# Patient Record
Sex: Female | Born: 1950 | Race: White | Hispanic: No | Marital: Single | Smoking: Never smoker
Health system: Southern US, Community
[De-identification: ages and names within clinical notes are randomized; demographics above are authoritative.]

## PROBLEM LIST (undated history)

## (undated) DIAGNOSIS — M8580 Other specified disorders of bone density and structure, unspecified site: Secondary | ICD-10-CM

## (undated) DIAGNOSIS — I10 Essential (primary) hypertension: Secondary | ICD-10-CM

## (undated) DIAGNOSIS — E213 Hyperparathyroidism, unspecified: Secondary | ICD-10-CM

## (undated) DIAGNOSIS — E7800 Pure hypercholesterolemia, unspecified: Secondary | ICD-10-CM

## (undated) DIAGNOSIS — G43909 Migraine, unspecified, not intractable, without status migrainosus: Secondary | ICD-10-CM

## (undated) DIAGNOSIS — R011 Cardiac murmur, unspecified: Secondary | ICD-10-CM

## (undated) DIAGNOSIS — R7989 Other specified abnormal findings of blood chemistry: Secondary | ICD-10-CM

## (undated) HISTORY — DX: Other specified disorders of bone density and structure, unspecified site: M85.80

## (undated) HISTORY — PX: PARATHYROIDECTOMY: SHX19

## (undated) HISTORY — DX: Migraine, unspecified, not intractable, without status migrainosus: G43.909

## (undated) HISTORY — DX: Other specified abnormal findings of blood chemistry: R79.89

---

## 1999-05-03 ENCOUNTER — Other Ambulatory Visit: Admission: RE | Admit: 1999-05-03 | Discharge: 1999-05-03 | Payer: Self-pay | Admitting: Obstetrics & Gynecology

## 2000-06-21 ENCOUNTER — Other Ambulatory Visit: Admission: RE | Admit: 2000-06-21 | Discharge: 2000-06-21 | Payer: Self-pay | Admitting: Obstetrics & Gynecology

## 2001-09-05 ENCOUNTER — Other Ambulatory Visit: Admission: RE | Admit: 2001-09-05 | Discharge: 2001-09-05 | Payer: Self-pay | Admitting: Obstetrics & Gynecology

## 2002-10-22 ENCOUNTER — Other Ambulatory Visit: Admission: RE | Admit: 2002-10-22 | Discharge: 2002-10-22 | Payer: Self-pay | Admitting: Obstetrics & Gynecology

## 2003-10-29 ENCOUNTER — Other Ambulatory Visit: Admission: RE | Admit: 2003-10-29 | Discharge: 2003-10-29 | Payer: Self-pay | Admitting: Obstetrics & Gynecology

## 2004-11-12 ENCOUNTER — Other Ambulatory Visit: Admission: RE | Admit: 2004-11-12 | Discharge: 2004-11-12 | Payer: Self-pay | Admitting: Obstetrics & Gynecology

## 2005-11-14 ENCOUNTER — Other Ambulatory Visit: Admission: RE | Admit: 2005-11-14 | Discharge: 2005-11-14 | Payer: Self-pay | Admitting: Obstetrics & Gynecology

## 2015-03-18 ENCOUNTER — Other Ambulatory Visit: Payer: Self-pay | Admitting: Internal Medicine

## 2015-03-18 DIAGNOSIS — R160 Hepatomegaly, not elsewhere classified: Secondary | ICD-10-CM

## 2015-03-30 ENCOUNTER — Ambulatory Visit
Admission: RE | Admit: 2015-03-30 | Discharge: 2015-03-30 | Disposition: A | Discharge status: Home / Self Care | Payer: 59 | Source: Ambulatory Visit | Attending: Family Medicine | Admitting: Family Medicine

## 2015-03-30 DIAGNOSIS — R160 Hepatomegaly, not elsewhere classified: Secondary | ICD-10-CM

## 2015-03-30 MED ORDER — GADOBENATE DIMEGLUMINE 529 MG/ML IV SOLN
15.0000 mL | Freq: Once | INTRAVENOUS | Status: AC | PRN
  Administered 2015-03-30: 15 mL via INTRAVENOUS

## 2015-10-12 ENCOUNTER — Encounter (HOSPITAL_BASED_OUTPATIENT_CLINIC_OR_DEPARTMENT_OTHER): Payer: Self-pay

## 2015-10-12 ENCOUNTER — Emergency Department (HOSPITAL_BASED_OUTPATIENT_CLINIC_OR_DEPARTMENT_OTHER): Payer: 59

## 2015-10-12 ENCOUNTER — Emergency Department (HOSPITAL_BASED_OUTPATIENT_CLINIC_OR_DEPARTMENT_OTHER)
Admission: EM | Admit: 2015-10-12 | Discharge: 2015-10-12 | Disposition: A | Discharge status: Home / Self Care | Payer: 59 | Attending: Emergency Medicine | Admitting: Emergency Medicine

## 2015-10-12 DIAGNOSIS — I10 Essential (primary) hypertension: Secondary | ICD-10-CM | POA: Diagnosis present

## 2015-10-12 DIAGNOSIS — R51 Headache: Secondary | ICD-10-CM | POA: Diagnosis not present

## 2015-10-12 DIAGNOSIS — R011 Cardiac murmur, unspecified: Secondary | ICD-10-CM | POA: Insufficient documentation

## 2015-10-12 HISTORY — DX: Pure hypercholesterolemia, unspecified: E78.00

## 2015-10-12 HISTORY — DX: Cardiac murmur, unspecified: R01.1

## 2015-10-12 LAB — CBC WITH DIFFERENTIAL/PLATELET
BASOS PCT: 0 %
Basophils Absolute: 0 10*3/uL (ref 0.0–0.1)
EOS ABS: 0 10*3/uL (ref 0.0–0.7)
EOS PCT: 0 %
HCT: 41.4 % (ref 36.0–46.0)
HEMOGLOBIN: 13.5 g/dL (ref 12.0–15.0)
Lymphocytes Relative: 28 %
Lymphs Abs: 2.6 10*3/uL (ref 0.7–4.0)
MCH: 30.2 pg (ref 26.0–34.0)
MCHC: 32.6 g/dL (ref 30.0–36.0)
MCV: 92.6 fL (ref 78.0–100.0)
MONOS PCT: 3 %
Monocytes Absolute: 0.3 10*3/uL (ref 0.1–1.0)
NEUTROS PCT: 69 %
Neutro Abs: 6.2 10*3/uL (ref 1.7–7.7)
PLATELETS: 311 10*3/uL (ref 150–400)
RBC: 4.47 MIL/uL (ref 3.87–5.11)
RDW: 12.9 % (ref 11.5–15.5)
WBC: 9.1 10*3/uL (ref 4.0–10.5)

## 2015-10-12 LAB — HEPATIC FUNCTION PANEL
ALT: 11 U/L — AB (ref 14–54)
AST: 21 U/L (ref 15–41)
Albumin: 4.5 g/dL (ref 3.5–5.0)
Alkaline Phosphatase: 65 U/L (ref 38–126)
TOTAL PROTEIN: 7.8 g/dL (ref 6.5–8.1)
Total Bilirubin: 0.5 mg/dL (ref 0.3–1.2)

## 2015-10-12 LAB — URINE MICROSCOPIC-ADD ON

## 2015-10-12 LAB — BASIC METABOLIC PANEL
Anion gap: 8 (ref 5–15)
BUN: 8 mg/dL (ref 6–20)
CALCIUM: 12.1 mg/dL — AB (ref 8.9–10.3)
CHLORIDE: 103 mmol/L (ref 101–111)
CO2: 29 mmol/L (ref 22–32)
Creatinine, Ser: 0.83 mg/dL (ref 0.44–1.00)
GFR calc Af Amer: >60 mL/min (ref >60)
GFR calc non Af Amer: >60 mL/min (ref >60)
GLUCOSE: 113 mg/dL — AB (ref 65–99)
Potassium: 4.5 mmol/L (ref 3.5–5.1)
SODIUM: 140 mmol/L (ref 135–145)

## 2015-10-12 LAB — URINALYSIS, ROUTINE W REFLEX MICROSCOPIC
BILIRUBIN URINE: NEGATIVE
GLUCOSE, UA: NEGATIVE mg/dL
KETONES UR: NEGATIVE mg/dL
Leukocytes, UA: NEGATIVE
Nitrite: NEGATIVE
PROTEIN: 30 mg/dL — AB
Specific Gravity, Urine: 1.007 (ref 1.005–1.030)
UROBILINOGEN UA: 0.2 mg/dL (ref 0.0–1.0)
pH: 6.5 (ref 5.0–8.0)

## 2015-10-12 MED ORDER — HYDROCHLOROTHIAZIDE 25 MG PO TABS: 25.0000 mg | ORAL_TABLET | Freq: Every day | ORAL | Status: DC

## 2015-10-12 NOTE — ED Notes (Signed)
Patient transported to X-ray 

## 2015-10-12 NOTE — ED Notes (Addendum)
Elevated BP x today-no hx of same-denies pain-steady gait-NAD-was seen by PCP today with new rx for HTN-states office did EKG and labs-pt appears very exited with nonstop talking-states she has been under a great deal of stress

## 2015-10-12 NOTE — ED Provider Notes (Signed)
CSN: [id number]     Arrival date & time 10/12/15  1429 History  By signing my name below, I, Meriel Pica, attest that this documentation has been prepared under the direction and in the presence of Delora Fuel, MD. Electronically Signed: Meriel Pica, ED Scribe. 10/12/2015. 5:23 PM.   Chief Complaint  Patient presents with  . Hypertension   The history is provided by the patient. No language interpreter was used.   HPI Comments: Lori Cohen is a 64 y.o. female, with a PMhx of HLD, who presents to the Emergency Department complaining of hypertension onset this morning with the associated intermittent sensation of  'numbness in my head' that lasts for several minutes at onset. Pt reports she checked her BP this morning because she was feeling 'weird' and found it to be 200/114mmHg. She does not check her BP regularly at home and notes the last time she checked it herself was 6 months ago. The pt was then seen by an MD at [facility] today and prescribed a new HTN medication and advised to seek ED evaluation if she began to feel 'different'; she has not taken a dose if the medication yet. She has a follow up appointment with this MD in 1 week. She is not prescribed any other medications but endorses taking sudafed and tylenol sporadically for migraines. The pt states she does not have a PCP and has only visited the [facility] twice. 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 Denies dizziness, light-headedness, tinnitus, visual changes, epistaxis, aphasia, or weakness. Pt does not smoke tobacco products.   Past Medical History  Diagnosis Date  . High cholesterol   . Heart murmur    History reviewed. No pertinent past surgical history. No family history on file. Social History  Substance Use Topics  . Smoking status: Never Smoker   . Smokeless tobacco: None  . Alcohol Use: No   OB History    No data available     Review of Systems  HENT: Negative for nosebleeds.    Eyes: Negative for photophobia and visual disturbance.  Neurological: Positive for headaches ( 'numbness in my head'). Negative for dizziness, speech difficulty, weakness and light-headedness.  All other systems reviewed and are negative.  Allergies  Review of patient's allergies indicates no known allergies.  Home Medications   Prior to Admission medications   Not on File   BP 183/98 mmHg  Pulse 110  Temp(Src) 98 F (36.7 C) (Oral)  Resp 18  Ht 5\' 1"  (1.549 m)  Wt 173 lb (78.472 kg)  BMI 32.70 kg/m2  SpO2 100% Physical Exam  Constitutional: She is oriented to person, place, and time. She appears well-developed and well-nourished.  HENT:  Head: Normocephalic and atraumatic.  Eyes: EOM are normal. Pupils are equal, round, and reactive to light.  Fundi are normal.  Neck: Normal range of motion. Neck supple. No JVD present.  Cardiovascular: Normal rate, regular rhythm and normal heart sounds.   No murmur heard. Pulmonary/Chest: Effort normal and breath sounds normal. She has no wheezes. She has no rales. She exhibits no tenderness.  Abdominal: Soft. Bowel sounds are normal. She exhibits no distension and no mass. There is no tenderness.  Musculoskeletal: Normal range of motion. She exhibits edema.  Trace pitting edema  Lymphadenopathy:    She has no cervical adenopathy.  Neurological: She is alert and oriented to person, place, and time. No cranial nerve deficit.  Skin: Skin is warm and dry. No  rash noted.  Psychiatric: She has a normal mood and affect. Her behavior is normal. Judgment and thought content normal.  Nursing note and vitals reviewed.   ED Course  Procedures  DIAGNOSTIC STUDIES: Oxygen Saturation is 100% on RA, normal by my interpretation.    COORDINATION OF CARE: 5:22 PM Discussed treatment plan with pt at bedside and pt agreed to plan.  Results for orders placed or performed during the hospital encounter of 83/38/25  Basic metabolic panel  Result  Value Ref Range   Sodium 140 135 - 145 mmol/L   Potassium 4.5 3.5 - 5.1 mmol/L   Chloride 103 101 - 111 mmol/L   CO2 29 22 - 32 mmol/L   Glucose, Bld 113 (H) 65 - 99 mg/dL   BUN 8 6 - 20 mg/dL   Creatinine, Ser 0.83 0.44 - 1.00 mg/dL   Calcium 12.1 (H) 8.9 - 10.3 mg/dL   GFR calc non Af Amer >60 >60 mL/min   GFR calc Af Amer >60 >60 mL/min   Anion gap 8 5 - 15  CBC with Differential  Result Value Ref Range   WBC 9.1 4.0 - 10.5 K/uL   RBC 4.47 3.87 - 5.11 MIL/uL   Hemoglobin 13.5 12.0 - 15.0 g/dL   HCT 41.4 36.0 - 46.0 %   MCV 92.6 78.0 - 100.0 fL   MCH 30.2 26.0 - 34.0 pg   MCHC 32.6 30.0 - 36.0 g/dL   RDW 12.9 11.5 - 15.5 %   Platelets 311 150 - 400 K/uL   Neutrophils Relative % 69 %   Neutro Abs 6.2 1.7 - 7.7 K/uL   Lymphocytes Relative 28 %   Lymphs Abs 2.6 0.7 - 4.0 K/uL   Monocytes Relative 3 %   Monocytes Absolute 0.3 0.1 - 1.0 K/uL   Eosinophils Relative 0 %   Eosinophils Absolute 0.0 0.0 - 0.7 K/uL   Basophils Relative 0 %   Basophils Absolute 0.0 0.0 - 0.1 K/uL  Urinalysis, Routine w reflex microscopic  Result Value Ref Range   Color, Urine YELLOW YELLOW   APPearance CLEAR CLEAR   Specific Gravity, Urine 1.007 1.005 - 1.030   pH 6.5 5.0 - 8.0   Glucose, UA NEGATIVE NEGATIVE mg/dL   Hgb urine dipstick MODERATE (A) NEGATIVE   Bilirubin Urine NEGATIVE NEGATIVE   Ketones, ur NEGATIVE NEGATIVE mg/dL   Protein, ur 30 (A) NEGATIVE mg/dL   Urobilinogen, UA 0.2 0.0 - 1.0 mg/dL   Nitrite NEGATIVE NEGATIVE   Leukocytes, UA NEGATIVE NEGATIVE  Hepatic function panel  Result Value Ref Range   Total Protein 7.8 6.5 - 8.1 g/dL   Albumin 4.5 3.5 - 5.0 g/dL   AST 21 15 - 41 U/L   ALT 11 (L) 14 - 54 U/L   Alkaline Phosphatase 65 38 - 126 U/L   Total Bilirubin 0.5 0.3 - 1.2 mg/dL   Bilirubin, Direct <0.1 (L) 0.1 - 0.5 mg/dL   Indirect Bilirubin NOT CALCULATED 0.3 - 0.9 mg/dL  Urine microscopic-add on  Result Value Ref Range   Squamous Epithelial / LPF RARE RARE   WBC,  UA 7-10 <3 WBC/hpf   Bacteria, UA RARE RARE   Dg Chest 2 View  10/12/2015  CLINICAL DATA:  64 year old female with a history of hypertension EXAM: CHEST - 2 VIEW COMPARISON:  None. FINDINGS: Cardiomediastinal silhouette projects within normal limits in size and contour. No confluent airspace disease, pneumothorax, or pleural effusion. No displaced fracture. Unremarkable appearance of the upper  abdomen. IMPRESSION: No radiographic evidence of acute cardiopulmonary disease. Signed, Dulcy Fanny. Earleen Newport, DO [facility] Electronically Signed   By: Corrie Mckusick D.O.   On: 10/12/2015 17:47   I have personally reviewed and evaluated these images and lab results as part of my medical decision-making.   EKG Interpretation   Date/Time:  Monday October 12 2015 14:54:08 EDT Ventricular Rate:  116 PR Interval:  172 QRS Duration: 80 QT Interval:  320 QTC Calculation: 444 R Axis:   17 Text Interpretation:  Sinus tachycardia Right atrial enlargement  Nonspecific ST and T wave abnormality Abnormal ECG No old tracing to  compare Confirmed by [facility]  MD, Mikaiah Stoffer ([id number]) on 10/12/2015 5:21:02 PM      MDM   Final diagnoses:  Essential hypertension  Hypercalcemia    Essential hypertension. Patient has not been monitoring her blood pressure at home. Blood pressure is not a dangerous level today. She had been prescribed something by her PCP but the pharmacy was out of it and she has requested that I give her a prescription for something. She does have mild edema, so decision is made to start her with a diuretic and she is given a prescription for hydrochlorothiazide. Screening labs are obtained and unexpected finding of elevated calcium was noted. Liver panel is normal. Chest x-ray is unremarkable. Urinalysis does show small amount of protein which could be related to hypertension. These findings were expanded patient and she is referred back to her PCP for  further outpatient workup.  I, Thomasina Housley, personally performed the services described in this documentation. All medical record entries made by the scribe were at my direction and in my presence.  I have reviewed the chart and discharge instructions and agree that the record reflects my personal performance and is accurate and complete. Brandan Glauber.  75/17/0017. 7:13 PM.      Delora Fuel, MD [id number]

## 2015-10-12 NOTE — ED Notes (Signed)
Ambulated pt. To bathroom.

## 2015-10-12 NOTE — ED Notes (Addendum)
Nurse first-Pt standing in ED lobby in conversation-NAD

## 2015-10-12 NOTE — Discharge Instructions (Signed)
Monitor your blood pressure at least once a day. Keep a log of it and take this log with you when you see her PCP. Try to stay on a low-salt diet.  Your calcium was elevated today. Please discuss this with your PCP. They may want to run additional tests, or, at the very least, repeat the test.  Your urine did show a small amount of protein. This may be the initial effects of blood pressure on your kidneys, although there are other things that can cause protein in your urine. Your PCP will monitor this.  Hypertension Hypertension, commonly called high blood pressure, is when the force of blood pumping through your arteries is too strong. Your arteries are the blood vessels that carry blood from your heart throughout your body. A blood pressure reading consists of a higher number over a lower number, such as 110/72. The higher number (systolic) is the pressure inside your arteries when your heart pumps. The lower number (diastolic) is the pressure inside your arteries when your heart relaxes. Ideally you want your blood pressure below 120/80. Hypertension forces your heart to work harder to pump blood. Your arteries may become narrow or stiff. Having untreated or uncontrolled hypertension can cause heart attack, stroke, kidney disease, and other problems. RISK FACTORS Some risk factors for high blood pressure are controllable. Others are not.  Risk factors you cannot control include:   Race. You may be at higher risk if you are African American.  Age. Risk increases with age.  Gender. Men are at higher risk than women before age 77 years. After age 66, women are at higher risk than men. Risk factors you can control include:  Not getting enough exercise or physical activity.  Being overweight.  Getting too much fat, sugar, calories, or salt in your diet.  Drinking too much alcohol. SIGNS AND SYMPTOMS Hypertension does not usually cause signs or symptoms. Extremely high blood pressure  (hypertensive crisis) may cause headache, anxiety, shortness of breath, and nosebleed. DIAGNOSIS To check if you have hypertension, your health care provider will measure your blood pressure while you are seated, with your arm held at the level of your heart. It should be measured at least twice using the same arm. Certain conditions can cause a difference in blood pressure between your right and left arms. A blood pressure reading that is higher than normal on one occasion does not mean that you need treatment. If it is not clear whether you have high blood pressure, you may be asked to return on a different day to have your blood pressure checked again. Or, you may be asked to monitor your blood pressure at home for 1 or more weeks. TREATMENT Treating high blood pressure includes making lifestyle changes and possibly taking medicine. Living a healthy lifestyle can help lower high blood pressure. You may need to change some of your habits. Lifestyle changes may include:  Following the DASH diet. This diet is high in fruits, vegetables, and whole grains. It is low in salt, red meat, and added sugars.  Keep your sodium intake below 2,300 mg per day.  Getting at least 30-45 minutes of aerobic exercise at least 4 times per week.  Losing weight if necessary.  Not smoking.  Limiting alcoholic beverages.  Learning ways to reduce stress. Your health care provider may prescribe medicine if lifestyle changes are not enough to get your blood pressure under control, and if one of the following is true:  You are 18-59 years  of age and your systolic blood pressure is above 140.  You are 11 years of age or older, and your systolic blood pressure is above 150.  Your diastolic blood pressure is above 90.  You have diabetes, and your systolic blood pressure is over 270 or your diastolic blood pressure is over 90.  You have kidney disease and your blood pressure is above 140/90.  You have heart disease  and your blood pressure is above 140/90. Your personal target blood pressure may vary depending on your medical conditions, your age, and other factors. HOME CARE INSTRUCTIONS  Have your blood pressure rechecked as directed by your health care provider.   Take medicines only as directed by your health care provider. Follow the directions carefully. Blood pressure medicines must be taken as prescribed. The medicine does not work as well when you skip doses. Skipping doses also puts you at risk for problems.  Do not smoke.   Monitor your blood pressure at home as directed by your health care provider. SEEK MEDICAL CARE IF:   You think you are having a reaction to medicines taken.  You have recurrent headaches or feel dizzy.  You have swelling in your ankles.  You have trouble with your vision. SEEK IMMEDIATE MEDICAL CARE IF:  You develop a severe headache or confusion.  You have unusual weakness, numbness, or feel faint.  You have severe chest or abdominal pain.  You vomit repeatedly.  You have trouble breathing. MAKE SURE YOU:   Understand these instructions.  Will watch your condition.  Will get help right away if you are not doing well or get worse.   This information is not intended to replace advice given to you by your health care provider. Make sure you discuss any questions you have with your health care provider.   Document Released: 12/12/2005 Document Revised: 04/28/2015 Document Reviewed: 10/04/2013 Elsevier Interactive Patient Education 2016 Elsevier Inc.  Low-Sodium Eating Plan Sodium raises blood pressure and causes water to be held in the body. Getting less sodium from food will help lower your blood pressure, reduce any swelling, and protect your heart, liver, and kidneys. We get sodium by adding salt (sodium chloride) to food. Most of our sodium comes from canned, boxed, and frozen foods. Restaurant foods, fast foods, and pizza are also very high in  sodium. Even if you take medicine to lower your blood pressure or to reduce fluid in your body, getting less sodium from your food is important. WHAT IS MY PLAN? Most people should limit their sodium intake to 2,300 mg a day. Your health care provider recommends that you limit your sodium intake to __________ a day.  WHAT DO I NEED TO KNOW ABOUT THIS EATING PLAN? For the low-sodium eating plan, you will follow these general guidelines:  Choose foods with a % Daily Value for sodium of less than 5% (as listed on the food label).   Use salt-free seasonings or herbs instead of table salt or sea salt.   Check with your health care provider or pharmacist before using salt substitutes.   Eat fresh foods.  Eat more vegetables and fruits.  Limit canned vegetables. If you do use them, rinse them well to decrease the sodium.   Limit cheese to 1 oz (28 g) per day.   Eat lower-sodium products, often labeled as "lower sodium" or "no salt added."  Avoid foods that contain monosodium glutamate (MSG). MSG is sometimes added to [place] food and some canned foods.  Check food labels (Nutrition Facts labels) on foods to learn how much sodium is in one serving.  Eat more home-cooked food and less restaurant, buffet, and fast food.  When eating at a restaurant, ask that your food be prepared with less salt, or no salt if possible.  HOW DO I READ FOOD LABELS FOR SODIUM INFORMATION? The Nutrition Facts label lists the amount of sodium in one serving of the food. If you eat more than one serving, you must multiply the listed amount of sodium by the number of servings. Food labels may also identify foods as:  Sodium free--Less than 5 mg in a serving.  Very low sodium--35 mg or less in a serving.  Low sodium--140 mg or less in a serving.  Light in sodium--50% less sodium in a serving. For example, if a food that usually has 300 mg of sodium is changed to become light in sodium, it will have 150  mg of sodium.  Reduced sodium--25% less sodium in a serving. For example, if a food that usually has 400 mg of sodium is changed to reduced sodium, it will have 300 mg of sodium. WHAT FOODS CAN I EAT? Grains Low-sodium cereals, including oats, puffed wheat and rice, and shredded wheat cereals. Low-sodium crackers. Unsalted rice and pasta. Lower-sodium bread.  Vegetables Frozen or fresh vegetables. Low-sodium or reduced-sodium canned vegetables. Low-sodium or reduced-sodium tomato sauce and paste. Low-sodium or reduced-sodium tomato and vegetable juices.  Fruits Fresh, frozen, and canned fruit. Fruit juice.  Meat and Other Protein Products Low-sodium canned tuna and salmon. Fresh or frozen meat, poultry, seafood, and fish. Lamb. Unsalted nuts. Dried beans, peas, and lentils without added salt. Unsalted canned beans. Homemade soups without salt. Eggs.  Dairy Milk. Soy milk. Ricotta cheese. Low-sodium or reduced-sodium cheeses. Yogurt.  Condiments Fresh and dried herbs and spices. Salt-free seasonings. Onion and garlic powders. Low-sodium varieties of mustard and ketchup. Fresh or refrigerated horseradish. Lemon juice.  Fats and Oils Reduced-sodium salad dressings. Unsalted butter.  Other Unsalted popcorn and pretzels.  The items listed above may not be a complete list of recommended foods or beverages. Contact your dietitian for more options. WHAT FOODS ARE NOT RECOMMENDED? Grains Instant hot cereals. Bread stuffing, pancake, and biscuit mixes. Croutons. Seasoned rice or pasta mixes. Noodle soup cups. Boxed or frozen macaroni and cheese. Self-rising flour. Regular salted crackers. Vegetables Regular canned vegetables. Regular canned tomato sauce and paste. Regular tomato and vegetable juices. Frozen vegetables in sauces. Salted [place] fries. Olives. Angie Fava. Relishes. Sauerkraut. Salsa. Meat and Other Protein Products Salted, canned, smoked, spiced, or pickled meats, seafood, or  fish. Bacon, ham, sausage, hot dogs, corned beef, chipped beef, and packaged luncheon meats. Salt pork. Jerky. Pickled herring. Anchovies, regular canned tuna, and sardines. Salted nuts. Dairy Processed cheese and cheese spreads. Cheese curds. Blue cheese and cottage cheese. Buttermilk.  Condiments Onion and garlic salt, seasoned salt, table salt, and sea salt. Canned and packaged gravies. Worcestershire sauce. Tartar sauce. Barbecue sauce. Teriyaki sauce. Soy sauce, including reduced sodium. Steak sauce. Fish sauce. Oyster sauce. Cocktail sauce. Horseradish that you find on the shelf. Regular ketchup and mustard. Meat flavorings and tenderizers. Bouillon cubes. Hot sauce. Tabasco sauce. Marinades. Taco seasonings. Relishes. Fats and Oils Regular salad dressings. Salted butter. Margarine. Ghee. Bacon fat.  Other Potato and tortilla chips. Corn chips and puffs. Salted popcorn and pretzels. Canned or dried soups. Pizza. Frozen entrees and pot pies.  The items listed above may not be a complete list of foods  and beverages to avoid. Contact your dietitian for more information.   This information is not intended to replace advice given to you by your health care provider. Make sure you discuss any questions you have with your health care provider.   Document Released: 06/03/2002 Document Revised: 01/02/2015 Document Reviewed: 10/16/2013 Elsevier Interactive Patient Education 2016 Elsevier Inc.  Hydrochlorothiazide, HCTZ capsules or tablets What is this medicine? HYDROCHLOROTHIAZIDE (hye droe klor oh THYE a zide) is a diuretic. It increases the amount of urine passed, which causes the body to lose salt and water. This medicine is used to treat high blood pressure. It is also reduces the swelling and water retention caused by various medical conditions, such as heart, liver, or kidney disease. This medicine may be used for other purposes; ask your health care provider or pharmacist if you have  questions. What should I tell my health care provider before I take this medicine? They need to know if you have any of these conditions: -diabetes -gout -immune system problems, like lupus -kidney disease or kidney stones -liver disease -pancreatitis -small amount of urine or difficulty passing urine -an unusual or allergic reaction to hydrochlorothiazide, sulfa drugs, other medicines, foods, dyes, or preservatives -pregnant or trying to get pregnant -breast-feeding How should I use this medicine? Take this medicine by mouth with a glass of water. Follow the directions on the prescription label. Take your medicine at regular intervals. Remember that you will need to pass urine frequently after taking this medicine. Do not take your doses at a time of day that will cause you problems. Do not stop taking your medicine unless your doctor tells you to. Talk to your pediatrician regarding the use of this medicine in children. Special care may be needed. Overdosage: If you think you have taken too much of this medicine contact a poison control center or emergency room at once. NOTE: This medicine is only for you. Do not share this medicine with others. What if I miss a dose? If you miss a dose, take it as soon as you can. If it is almost time for your next dose, take only that dose. Do not take double or extra doses. What may interact with this medicine? -cholestyramine -colestipol -digoxin -dofetilide -lithium -medicines for blood pressure -medicines for diabetes -medicines that relax muscles for surgery -other diuretics -steroid medicines like prednisone or cortisone This list may not describe all possible interactions. Give your health care provider a list of all the medicines, herbs, non-prescription drugs, or dietary supplements you use. Also tell them if you smoke, drink alcohol, or use illegal drugs. Some items may interact with your medicine. What should I watch for while using  this medicine? Visit your doctor or health care professional for regular checks on your progress. Check your blood pressure as directed. Ask your doctor or health care professional what your blood pressure should be and when you should contact him or her. You may need to be on a special diet while taking this medicine. Ask your doctor. Check with your doctor or health care professional if you get an attack of severe diarrhea, nausea and vomiting, or if you sweat a lot. The loss of too much body fluid can make it dangerous for you to take this medicine. You may get drowsy or dizzy. Do not drive, use machinery, or do anything that needs mental alertness until you know how this medicine affects you. Do not stand or sit up quickly, especially if you are an older  patient. This reduces the risk of dizzy or fainting spells. Alcohol may interfere with the effect of this medicine. Avoid alcoholic drinks. This medicine may affect your blood sugar level. If you have diabetes, check with your doctor or health care professional before changing the dose of your diabetic medicine. This medicine can make you more sensitive to the sun. Keep out of the sun. If you cannot avoid being in the sun, wear protective clothing and use sunscreen. Do not use sun lamps or tanning beds/booths. What side effects may I notice from receiving this medicine? Side effects that you should report to your doctor or health care professional as soon as possible: -allergic reactions such as skin rash or itching, hives, swelling of the lips, mouth, tongue, or throat -changes in vision -chest pain -eye pain -fast or irregular heartbeat -feeling faint or lightheaded, falls -gout attack -muscle pain or cramps -pain or difficulty when passing urine -pain, tingling, numbness in the hands or feet -redness, blistering, peeling or loosening of the skin, including inside the mouth -unusually weak or tired Side effects that usually do not require  medical attention (report to your doctor or health care professional if they continue or are bothersome): -change in sex drive or performance -dry mouth -headache -stomach upset This list may not describe all possible side effects. Call your doctor for medical advice about side effects. You may report side effects to FDA at 1-800-FDA-1088. Where should I keep my medicine? Keep out of the reach of children. Store at room temperature between 15 and 30 degrees C (59 and 86 degrees F). Do not freeze. Protect from light and moisture. Keep container closed tightly. Throw away any unused medicine after the expiration date. NOTE: This sheet is a summary. It may not cover all possible information. If you have questions about this medicine, talk to your doctor, pharmacist, or health care provider.    2016, Elsevier/Gold Standard. (2010-08-06 12:57:37)

## 2015-10-12 NOTE — ED Notes (Signed)
Nurse first-pt continues to stand in conversation in the ED lobby-NAD

## 2016-03-27 ENCOUNTER — Encounter (HOSPITAL_BASED_OUTPATIENT_CLINIC_OR_DEPARTMENT_OTHER): Payer: Self-pay | Admitting: Emergency Medicine

## 2016-03-27 ENCOUNTER — Emergency Department (HOSPITAL_BASED_OUTPATIENT_CLINIC_OR_DEPARTMENT_OTHER)
Admission: EM | Admit: 2016-03-27 | Discharge: 2016-03-27 | Disposition: A | Discharge status: Home / Self Care | Payer: 59 | Attending: Emergency Medicine | Admitting: Emergency Medicine

## 2016-03-27 DIAGNOSIS — I10 Essential (primary) hypertension: Secondary | ICD-10-CM

## 2016-03-27 DIAGNOSIS — Z79899 Other long term (current) drug therapy: Secondary | ICD-10-CM | POA: Insufficient documentation

## 2016-03-27 DIAGNOSIS — R011 Cardiac murmur, unspecified: Secondary | ICD-10-CM | POA: Insufficient documentation

## 2016-03-27 DIAGNOSIS — Z8639 Personal history of other endocrine, nutritional and metabolic disease: Secondary | ICD-10-CM | POA: Insufficient documentation

## 2016-03-27 HISTORY — DX: Essential (primary) hypertension: I10

## 2016-03-27 NOTE — ED Provider Notes (Signed)
TIME SEEN: 11:16 PM  CHIEF COMPLAINT:  Chief Complaint  Patient presents with  . Hypertension   HPI:  HPI Comments: Lori Cohen is a 65 y.o. female with a pmhx of HTN and HLD, who presents to the Emergency Department complaining of HTN when she checked her BP tonight shortly after taking a walk around 8PM, with a reading of 200/110. Pt felt at baseline, however got worried when she checked her BP and it read high. She checks her BP regularly and keeps a log of the readings. Pt had a similar spike in BP in October 2016, at which time she had head heaviness. However she did not feel any HA today. Pt denies any HA, vision changes, back pain, abdominal pain, CP, SOB, or weakness or numbness in any extremities. Pt states that she was initially on hydrochlorothiazide When discharged from the emergency department but then was changed to Lasix by her PCP. She's been off this medication for several weeks. States that normally her blood pressure at home is normal.  ROS: See HPI Constitutional: no fever  Eyes: no drainage  ENT: no runny nose   Cardiovascular:  no chest pain  Resp: no SOB  GI: no vomiting GU: no dysuria Integumentary: no rash  Allergy: no hives  Musculoskeletal: no leg swelling  Neurological: no slurred speech ROS otherwise negative  PAST MEDICAL HISTORY/PAST SURGICAL HISTORY:  Past Medical History  Diagnosis Date  . High cholesterol   . Heart murmur   . Hypertension     MEDICATIONS:  Prior to Admission medications   Medication Sig Start Date End Date Taking? Authorizing Provider  furosemide (LASIX) 20 MG tablet Take 20 mg by mouth.   Yes Historical Provider, MD  hydrochlorothiazide (HYDRODIURIL) 25 MG tablet Take 1 tablet (25 mg total) by mouth daily. [id number]   Delora Fuel, MD    ALLERGIES:  No Known Allergies  SOCIAL HISTORY:  Social History  Substance Use Topics  . Smoking status: Never Smoker   . Smokeless tobacco: Not on file  . Alcohol Use: No     FAMILY HISTORY: No family history on file.  EXAM: BP 152/115 mmHg  Pulse 118  Temp(Src) 98.5 F (36.9 C) (Oral)  Resp 20  Ht 5\' 1"  (1.549 m)  Wt 167 lb (75.751 kg)  BMI 31.57 kg/m2  SpO2 100% CONSTITUTIONAL: Alert and oriented and responds appropriately to questions. Well-appearing; well-nourished HEAD: Normocephalic EYES: Conjunctivae clear, PERRL, extra ocular Movements intact ENT: normal nose; no rhinorrhea; moist mucous membranes NECK: Supple, no meningismus, no LAD  CARD: RRR; S1 and S2 appreciated; no murmurs, no clicks, no rubs, no gallops RESP: Normal chest excursion without splinting or tachypnea; breath sounds clear and equal bilaterally; no wheezes, no rhonchi, no rales, no hypoxia or respiratory distress, speaking full sentences ABD/GI: Normal bowel sounds; non-distended; soft, non-tender, no rebound, no guarding, no peritoneal signs BACK:  The back appears normal and is non-tender to palpation, there is no CVA tenderness EXT: Normal ROM in all joints; non-tender to palpation; no edema; normal capillary refill; no cyanosis, no calf tenderness or swelling    SKIN: Normal color for age and race; warm; no rash NEURO: Moves all extremities equally, sensation to light touch intact diffusely, cranial nerves II through XII intact PSYCH: The patient's mood and manner are appropriate. Grooming and personal hygiene are appropriate.  MEDICAL DECISION MAKING: Patient here with asymptomatic hypertension that has resolved. Blood pressure is now 135/92 with a heart rate in the 80s. Discussed  with patient that I do not feel she needs to be started on antihypertensives. She states that she checks her blood pressure daily and it is normal. Tonight it was elevated after a 40 minute walk. She is asymptomatic. I do not feel she needs further emergent workup. Have recommended close outpatient follow-up with her PCP. Patient is concerned because she has had elevated parathyroid level in the  past. Have advised her to have this checked by her PCP. Her EKG shows no interval changes. She is not having a symptoms of severe hypercalcemia.  At this time, I do not feel there is any life-threatening condition present. I have reviewed and discussed all results (EKG, imaging, lab, urine as appropriate), exam findings with patient. I have reviewed nursing notes and appropriate previous records.  I feel the patient is safe to be discharged home without further emergent workup. Discussed usual and customary return precautions. Patient and family (if present) verbalize understanding and are comfortable with this plan.  Patient will follow-up with their primary care provider. If they do not have a primary care provider, information for follow-up has been provided to them. All questions have been answered.     EKG Interpretation  Date/Time:  Sunday March 27 2016 21:37:12 EDT Ventricular Rate:  104 PR Interval:  184 QRS Duration: 86 QT Interval:  360 QTC Calculation: 473 R Axis:   21 Text Interpretation:  Sinus tachycardia Nonspecific ST abnormality Abnormal ECG No significant change since last tracing Confirmed by Winfred Leeds  MD, SAM [phone]) on 03/27/2016 9:51:15 PM       I personally performed the services described in this documentation, which was scribed in my presence. The recorded information has been reviewed and is accurate.   [place], DO 03/28/16 [phone]

## 2016-03-27 NOTE — ED Notes (Addendum)
Addendum: Pt checked her bp tonight and read 200/110. Pt recently d/c off antihypertension meds per physician. States she believes its her parathyroid.

## 2016-03-27 NOTE — Discharge Instructions (Signed)
Hypertension Hypertension, commonly called high blood pressure, is when the force of blood pumping through your arteries is too strong. Your arteries are the blood vessels that carry blood from your heart throughout your body. A blood pressure reading consists of a higher number over a lower number, such as 110/72. The higher number (systolic) is the pressure inside your arteries when your heart pumps. The lower number (diastolic) is the pressure inside your arteries when your heart relaxes. Ideally you want your blood pressure below 120/80. Hypertension forces your heart to work harder to pump blood. Your arteries may become narrow or stiff. Having untreated or uncontrolled hypertension can cause heart attack, stroke, kidney disease, and other problems. RISK FACTORS Some risk factors for high blood pressure are controllable. Others are not.  Risk factors you cannot control include:   Race. You may be at higher risk if you are African American.  Age. Risk increases with age.  Gender. Men are at higher risk than women before age 45 years. After age 65, women are at higher risk than men. Risk factors you can control include:  Not getting enough exercise or physical activity.  Being overweight.  Getting too much fat, sugar, calories, or salt in your diet.  Drinking too much alcohol. SIGNS AND SYMPTOMS Hypertension does not usually cause signs or symptoms. Extremely high blood pressure (hypertensive crisis) may cause headache, anxiety, shortness of breath, and nosebleed. DIAGNOSIS To check if you have hypertension, your health care provider will measure your blood pressure while you are seated, with your arm held at the level of your heart. It should be measured at least twice using the same arm. Certain conditions can cause a difference in blood pressure between your right and left arms. A blood pressure reading that is higher than normal on one occasion does not mean that you need treatment. If  it is not clear whether you have high blood pressure, you may be asked to return on a different day to have your blood pressure checked again. Or, you may be asked to monitor your blood pressure at home for 1 or more weeks. TREATMENT Treating high blood pressure includes making lifestyle changes and possibly taking medicine. Living a healthy lifestyle can help lower high blood pressure. You may need to change some of your habits. Lifestyle changes may include:  Following the DASH diet. This diet is high in fruits, vegetables, and whole grains. It is low in salt, red meat, and added sugars.  Keep your sodium intake below 2,300 mg per day.  Getting at least 30-45 minutes of aerobic exercise at least 4 times per week.  Losing weight if necessary.  Not smoking.  Limiting alcoholic beverages.  Learning ways to reduce stress. Your health care provider may prescribe medicine if lifestyle changes are not enough to get your blood pressure under control, and if one of the following is true:  You are 18-59 years of age and your systolic blood pressure is above 140.  You are 60 years of age or older, and your systolic blood pressure is above 150.  Your diastolic blood pressure is above 90.  You have diabetes, and your systolic blood pressure is over 140 or your diastolic blood pressure is over 90.  You have kidney disease and your blood pressure is above 140/90.  You have heart disease and your blood pressure is above 140/90. Your personal target blood pressure may vary depending on your medical conditions, your age, and other factors. HOME CARE INSTRUCTIONS    140/90.  · You have heart disease and your blood pressure is above 140/90.  Your personal target blood pressure may vary depending on your medical conditions, your age, and other factors.  HOME CARE INSTRUCTIONS  · Have your blood pressure rechecked as directed by your health care provider.    · Take medicines only as directed by your health care provider. Follow the directions carefully. Blood pressure medicines must be taken as prescribed. The medicine does not work as well when you skip doses. Skipping doses also puts you at risk for  problems.  · Do not smoke.    · Monitor your blood pressure at home as directed by your health care provider.   SEEK MEDICAL CARE IF:   · You think you are having a reaction to medicines taken.  · You have recurrent headaches or feel dizzy.  · You have swelling in your ankles.  · You have trouble with your vision.  SEEK IMMEDIATE MEDICAL CARE IF:  · You develop a severe headache or confusion.  · You have unusual weakness, numbness, or feel faint.  · You have severe chest or abdominal pain.  · You vomit repeatedly.  · You have trouble breathing.  MAKE SURE YOU:   · Understand these instructions.  · Will watch your condition.  · Will get help right away if you are not doing well or get worse.     This information is not intended to replace advice given to you by your health care provider. Make sure you discuss any questions you have with your health care provider.     Document Released: 12/12/2005 Document Revised: 04/28/2015 Document Reviewed: 10/04/2013  Elsevier Interactive Patient Education ©2016 [employer]      How to Take Your Blood Pressure  HOW DO I GET A BLOOD PRESSURE MACHINE?  · You can buy an electronic home blood pressure machine at your local pharmacy. Insurance will sometimes cover the cost if you have a prescription.  · Ask your doctor what type of machine is best for you. There are different machines for your arm and your wrist.  · If you decide to buy a machine to check your blood pressure on your arm, first check the size of your arm so you can buy the right size cuff. To check the size of your arm:      Use a measuring tape that shows both inches and centimeters.      Wrap the measuring tape around the upper-middle part of your arm. You may need someone to help you measure.      Write down your arm measurement in both inches and centimeters.    · To measure your blood pressure correctly, it is important to have the right size cuff.      If your arm is up to 13 inches (up to 34 centimeters), get  an adult cuff size.    If your arm is 13 to 17 inches (35 to 44 centimeters), get a large adult cuff size.       If your arm is 17 to 20 inches (45 to 52 centimeters), get an adult thigh cuff.    WHAT DO THE NUMBERS MEAN?   · There are two numbers that make up your blood pressure. For example: 120/80.    The first number (120 in our example) is called the "systolic pressure." It is a measure of the pressure in your blood vessels when your heart is pumping blood.    The second number (80 in our example) is called   the "diastolic pressure." It is a measure of the pressure in your blood vessels when your heart is resting between beats.  · Your doctor will tell you what your blood pressure should be.  WHAT SHOULD I DO BEFORE I CHECK MY BLOOD PRESSURE?   · Try to rest or relax for at least 30 minutes before you check your blood pressure.  · Do not smoke.  · Do not have any drinks with caffeine, such as:    Soda.    Coffee.    Tea.  · Check your blood pressure in a quiet room.  · Sit down and stretch out your arm on a table. Keep your arm at about the level of your heart. Let your arm relax.  · Make sure that your legs are not crossed.  HOW DO I CHECK MY BLOOD PRESSURE?  · Follow the directions that came with your machine.  · Make sure you remove any tight-fitting clothing from your arm or wrist. Wrap the cuff around your upper arm or wrist. You should be able to fit a finger between the cuff and your arm. If you cannot fit a finger between the cuff and your arm, it is too tight and should be removed and rewrapped.  · Some units require you to manually pump up the arm cuff.  · Automatic units inflate the cuff when you press a button.  · Cuff deflation is automatic in both models.  · After the cuff is inflated, the unit measures your blood pressure and pulse. The readings are shown on a monitor. Hold still and breathe normally while the cuff is inflated.  · Getting a reading takes less than a minute.  · Some models store  readings in a memory. Some provide a printout of readings. If your machine does not store your readings, keep a written record.  · Take readings with you to your next visit with your doctor.     This information is not intended to replace advice given to you by your health care provider. Make sure you discuss any questions you have with your health care provider.     Document Released: 11/24/2008 Document Revised: 01/02/2015 Document Reviewed: 02/06/2014  Elsevier Interactive Patient Education ©2016 [employer]    DASH Eating Plan  DASH stands for "Dietary Approaches to Stop Hypertension." The DASH eating plan is a healthy eating plan that has been shown to reduce high blood pressure (hypertension). Additional health benefits may include reducing the risk of type 2 diabetes mellitus, heart disease, and stroke. The DASH eating plan may also help with weight loss.  WHAT DO I NEED TO KNOW ABOUT THE DASH EATING PLAN?  For the DASH eating plan, you will follow these general guidelines:  · Choose foods with a percent daily value for sodium of less than 5% (as listed on the food label).  · Use salt-free seasonings or herbs instead of table salt or sea salt.  · Check with your health care provider or pharmacist before using salt substitutes.  · Eat lower-sodium products, often labeled as "lower sodium" or "no salt added."  · Eat fresh foods.  · Eat more vegetables, fruits, and low-fat dairy products.  · Choose whole grains. Look for the word "whole" as the first word in the ingredient list.  · Choose fish and skinless chicken or turkey more often than red meat. Limit fish, poultry, and meat to 6 oz (170 g) each day.  · Limit sweets, desserts, sugars, and   sugary drinks.  · Choose heart-healthy fats.  · Limit cheese to 1 oz (28 g) per day.  · Eat more home-cooked food and less restaurant, buffet, and fast food.  · Limit fried foods.  · Cook foods using methods other than frying.  · Limit canned vegetables. If you do use  them, rinse them well to decrease the sodium.  · When eating at a restaurant, ask that your food be prepared with less salt, or no salt if possible.  WHAT FOODS CAN I EAT?  Seek help from a dietitian for individual calorie needs.  Grains  Whole grain or whole wheat bread. Brown rice. Whole grain or whole wheat pasta. Quinoa, bulgur, and whole grain cereals. Low-sodium cereals. Corn or whole wheat flour tortillas. Whole grain cornbread. Whole grain crackers. Low-sodium crackers.  Vegetables  Fresh or frozen vegetables (raw, steamed, roasted, or grilled). Low-sodium or reduced-sodium tomato and vegetable juices. Low-sodium or reduced-sodium tomato sauce and paste. Low-sodium or reduced-sodium canned vegetables.   Fruits  All fresh, canned (in natural juice), or frozen fruits.  Meat and Other Protein Products  Ground beef (85% or leaner), grass-fed beef, or beef trimmed of fat. Skinless chicken or turkey. Ground chicken or turkey. Pork trimmed of fat. All fish and seafood. Eggs. Dried beans, peas, or lentils. Unsalted nuts and seeds. Unsalted canned beans.  Dairy  Low-fat dairy products, such as skim or 1% milk, 2% or reduced-fat cheeses, low-fat ricotta or cottage cheese, or plain low-fat yogurt. Low-sodium or reduced-sodium cheeses.  Fats and Oils  Tub margarines without trans fats. Light or reduced-fat mayonnaise and salad dressings (reduced sodium). Avocado. Safflower, olive, or canola oils. Natural peanut or almond butter.  Other  Unsalted popcorn and pretzels.  The items listed above may not be a complete list of recommended foods or beverages. Contact your dietitian for more options.  WHAT FOODS ARE NOT RECOMMENDED?  Grains  White bread. White pasta. White rice. Refined cornbread. Bagels and croissants. Crackers that contain trans fat.  Vegetables  Creamed or fried vegetables. Vegetables in a cheese sauce. Regular canned vegetables. Regular canned tomato sauce and paste. Regular tomato and vegetable  juices.  Fruits  Dried fruits. Canned fruit in light or heavy syrup. Fruit juice.  Meat and Other Protein Products  Fatty cuts of meat. Ribs, chicken wings, bacon, sausage, bologna, salami, chitterlings, fatback, hot dogs, bratwurst, and packaged luncheon meats. Salted nuts and seeds. Canned beans with salt.  Dairy  Whole or 2% milk, cream, half-and-half, and cream cheese. Whole-fat or sweetened yogurt. Full-fat cheeses or blue cheese. Nondairy creamers and whipped toppings. Processed cheese, cheese spreads, or cheese curds.  Condiments  Onion and garlic salt, seasoned salt, table salt, and sea salt. Canned and packaged gravies. Worcestershire sauce. Tartar sauce. Barbecue sauce. Teriyaki sauce. Soy sauce, including reduced sodium. Steak sauce. Fish sauce. Oyster sauce. Cocktail sauce. Horseradish. Ketchup and mustard. Meat flavorings and tenderizers. Bouillon cubes. Hot sauce. Tabasco sauce. Marinades. Taco seasonings. Relishes.  Fats and Oils  Butter, stick margarine, lard, shortening, ghee, and bacon fat. Coconut, palm kernel, or palm oils. Regular salad dressings.  Other  Pickles and olives. Salted popcorn and pretzels.  The items listed above may not be a complete list of foods and beverages to avoid. Contact your dietitian for more information.  WHERE CAN I FIND MORE INFORMATION?  [employer]: www.nhlbi.nih.gov/health/health-topics/topics/dash/     This information is not intended to replace advice given to you by your health care

## 2016-06-25 IMAGING — MR MR ABDOMEN WO/W CM
11 of 17 series · 28 of 48 positions shown · IV contrast (multihance)
Comparison: No priors.

CLINICAL DATA: 63-year-old female with history of cystic lesions in
the liver found on prior outside CT examination.

EXAM:
MRI ABDOMEN WITHOUT AND WITH CONTRAST
TECHNIQUE: Multiplanar multisequence MR imaging of the abdomen was performed
both before and after the administration of intravenous contrast.
CONTRAST:  15mL MULTIHANCE GADOBENATE DIMEGLUMINE 529 MG/ML IV SOLN

[Series 3: cor haste · coronal · 5.0mm · 0.70mm/px · 2 of 32 slices shown]
[im 1/32]
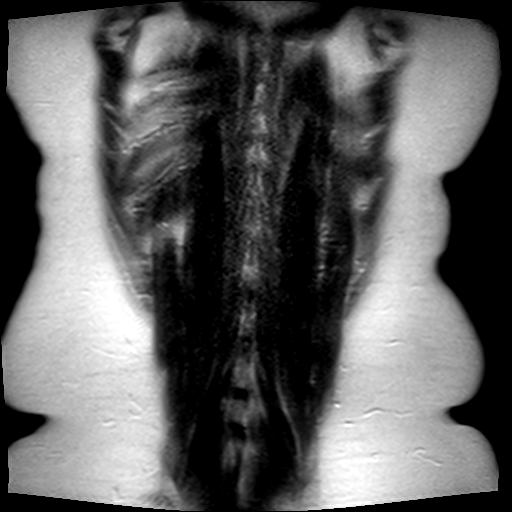
[im 32/32]
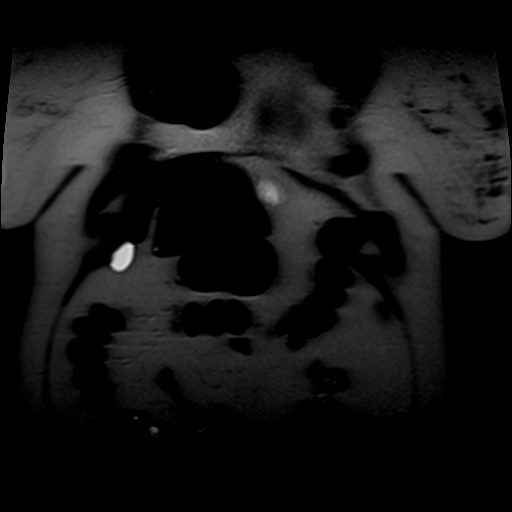

[Series 4: T1 · axial · 5.5mm · 0.74mm/px · z∈[-60,+115]mm · 4 of 60 slices shown]
[im 1/60]
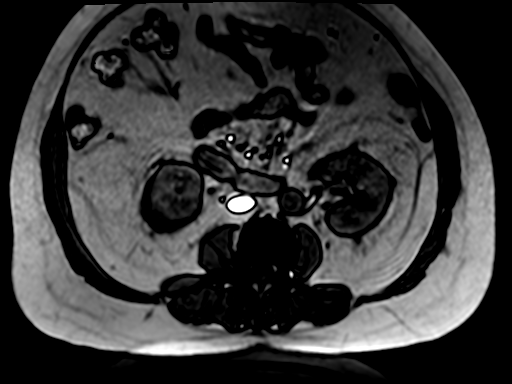
[im 20/60]
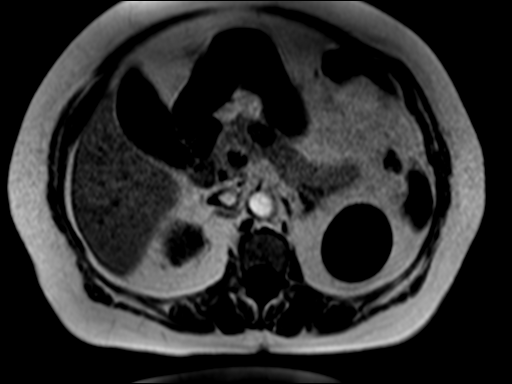
[im 40/60]
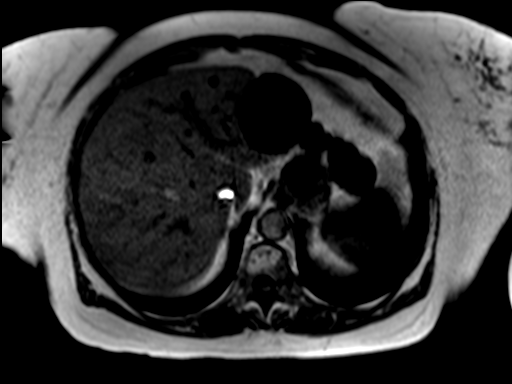
[im 60/60]
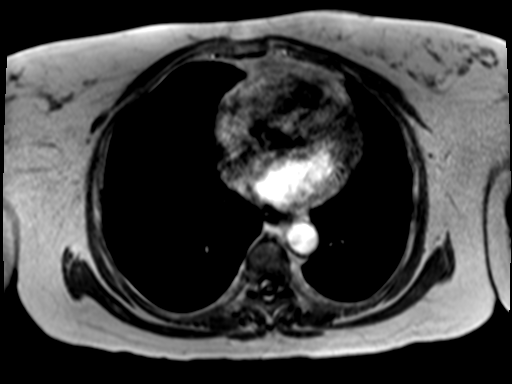

[Series 5: axial haste · axial · 5.5mm · 0.74mm/px · z∈[-60,+115]mm · 2 of 30 slices shown]
[im 1/30]
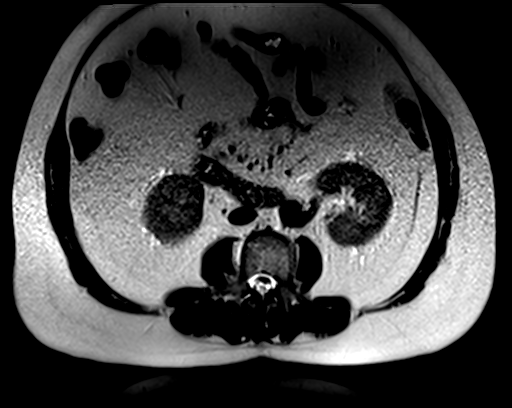
[im 30/30]
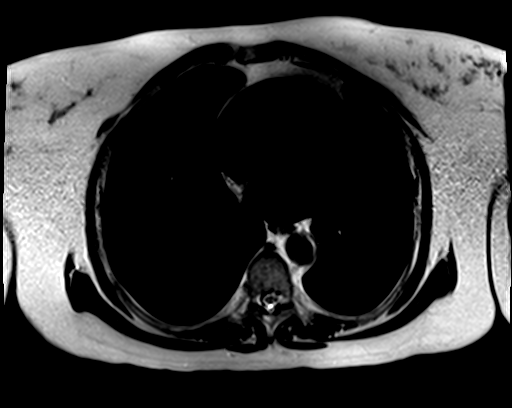

[Series 7: [id number]_trig · axial · 6.0mm · 1.98mm/px · z∈[-84,+125]mm · 5 of 90 slices shown]
[im 1/90]
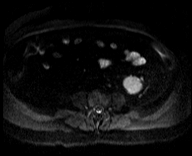
[im 23/90]
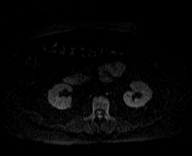
[im 45/90]
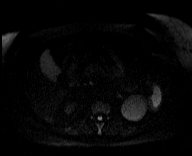
[im 67/90]
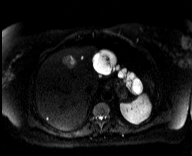
[im 90/90]
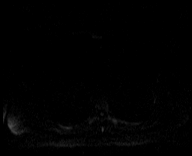

[Series 8: [id number]_trig_adc · axial · 6.0mm · 1.98mm/px · z∈[-84,+125]mm · 2 of 30 slices shown]
[im 1/30]
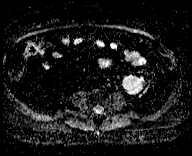
[im 30/30]
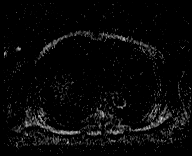

[Series 9: T2 · axial · 6.0mm · 0.94mm/px · 1 of 30 slices shown]
[im 1/30]
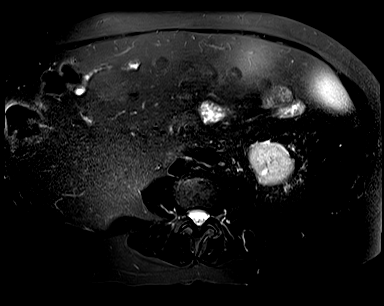

[Series 10: bSSFP · axial · 4.0mm · 0.74mm/px · z∈[-53,+107]mm · 2 of 41 slices shown]
[im 1/41]
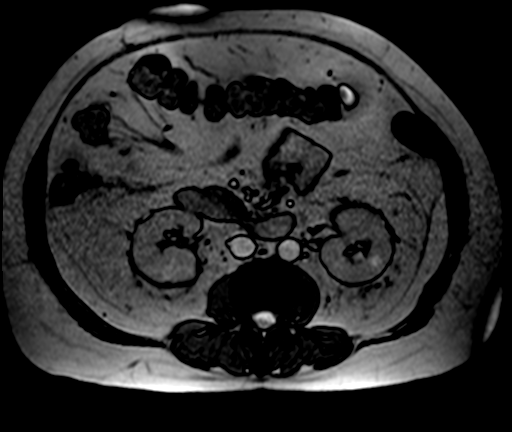
[im 41/41]
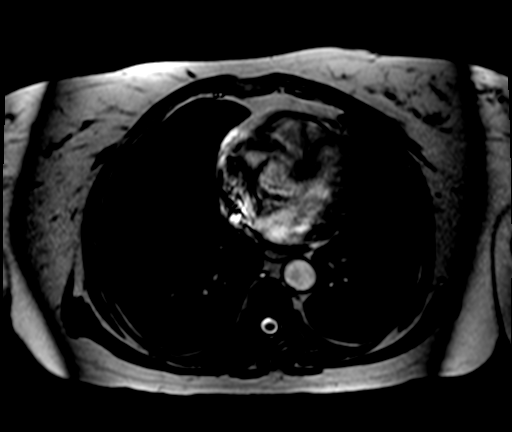

[Series 11: T1 dynamic · axial · non-contrast · 2.5mm · 0.74mm/px · z∈[-61,+116]mm · 3 of 72 slices shown]
[im 1/72]
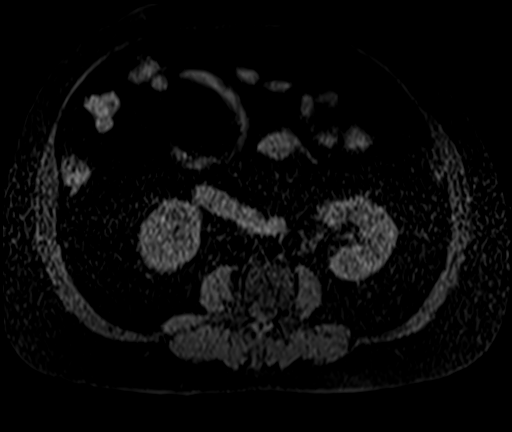
[im 36/72]
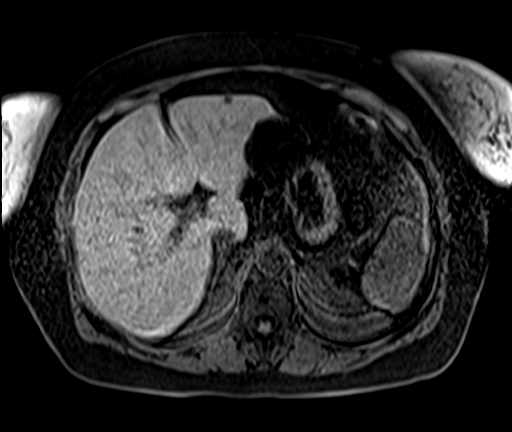
[im 72/72]
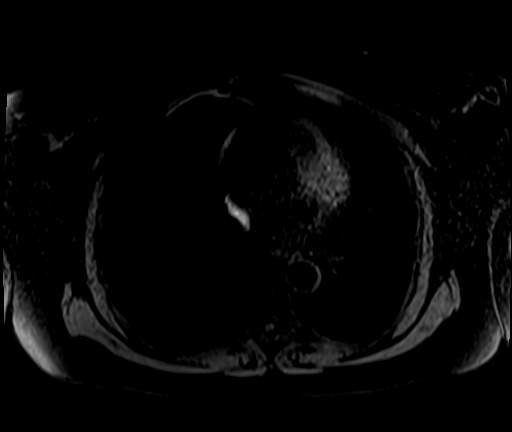

[Series 12: T1 dynamic post-contrast · axial · 2.5mm · 0.74mm/px · z∈[-61,+116]mm · 3 of 72 slices shown (1 of 3)]
[im 1/72]
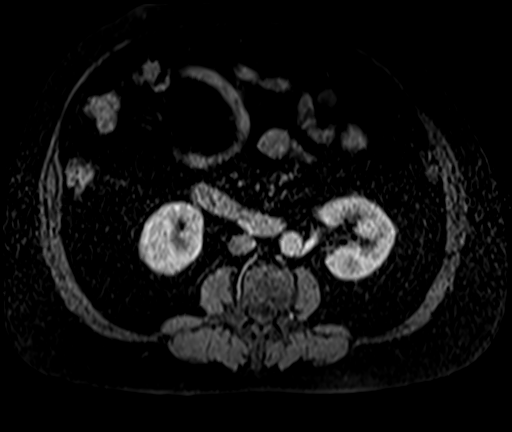
[im 36/72]
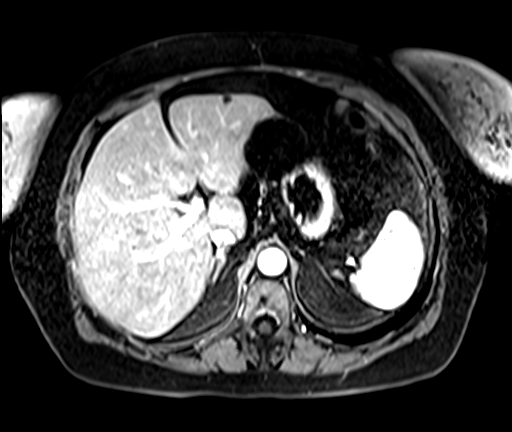
[im 72/72]
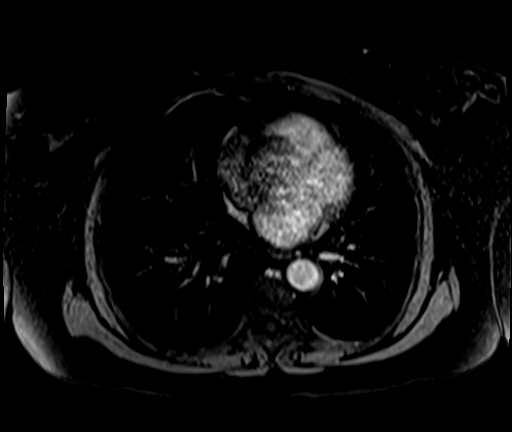

[Series 13: T1 dynamic post-contrast · axial · 2.5mm · 0.74mm/px · z∈[-61,+116]mm · 3 of 72 slices shown (2 of 3)]
[im 1/72]
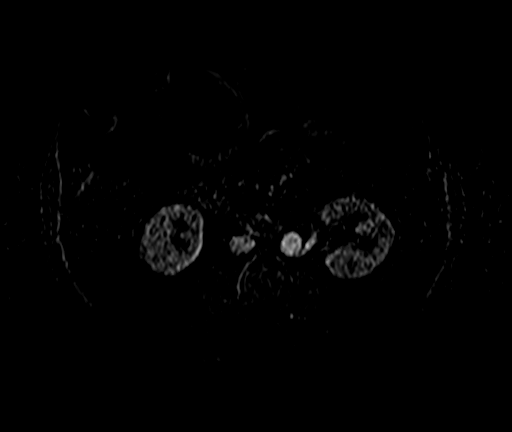
[im 36/72]
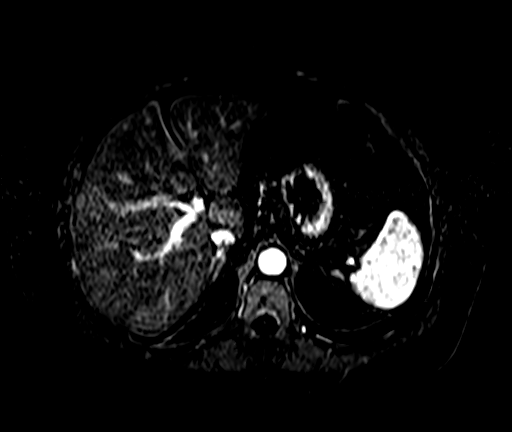
[im 72/72]
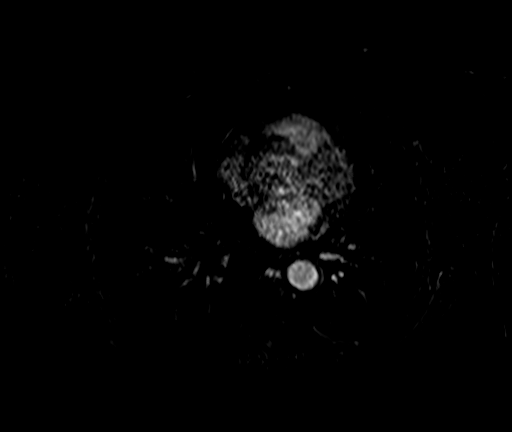

[Series 14: T1 dynamic post-contrast · axial · 2.5mm · 0.74mm/px · 1 of 72 slices shown (3 of 3)]
[im 1/72]
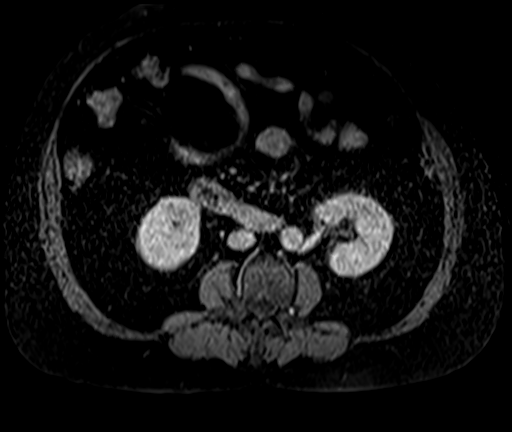

[28 of 48 positions shown; findings below may reference images not displayed]

FINDINGS: BUN and creatinine were obtained on site at [HOSPITAL] at

[HOSPITAL].

Results:  BUN 10 mg/dL,  Creatinine 0.8 mg/dL.

Lower chest:  Unremarkable.

Hepatobiliary: Multiple well-circumscribed T1 hypointense, T2
hyperintense, nonenhancing lesions in the liver, largest of which is
centered in the left lobe involving segments 2 and 3, measuring up
to 6.0 x 5.3 x 5.4 cm (image 12 of series 5). Most of these lesions
are simple in appearance, however, several have thin internal
septations. Overall, these are compatible with a combination of
simple cysts and minimally complex (mildly septated) cysts. No
suspicious appearing hepatic lesions are otherwise noted. No intra
or extrahepatic biliary ductal dilatation. 2.5 x 1.5 cm gallstone
lying dependently in the gallbladder. No current findings to suggest
an acute cholecystitis at this time.

Pancreas: No pancreatic mass. No pancreatic ductal dilatation. No
pancreatic or peripancreatic fluid collections or inflammatory
changes.

Spleen: Unremarkable.

Adrenals/Urinary Tract: Bilateral adrenal glands are normal in
appearance. Several T1 hypointense, T2 hyperintense, nonenhancing
lesions are noted in the kidneys bilaterally, most of which are
subcentimeter in size, compatible with simple cysts. The largest of
these lesions is exophytic extending off the upper pole of the left
kidney measuring up to 6.1 x 5.0 x 6.4 cm. No other suspicious renal
lesions are noted.

Stomach/Bowel: Visualized portions are unremarkable.

Vascular/Lymphatic: No aneurysm identified in the visualized
vasculature. No lymphadenopathy noted in the visualized abdomen.

Other: No significant volume of ascites noted in the visualized
portions of the peritoneal cavity.

Musculoskeletal: No aggressive osseous lesions noted in the
visualized skeleton. Probable hemangioma in the right side of the
T11 vertebral body incidentally noted.
IMPRESSION: 1. Multiple hepatic lesions have imaging characteristics compatible
with simple cysts and minimally complex cyst (which have only thin
internal septations). These findings are benign.
2. Cholelithiasis without evidence to suggest acute cholecystitis at
this time.
3. Multiple simple cysts in the kidneys bilaterally, as above.

## 2017-01-07 IMAGING — CR DG CHEST 2V
2 series · 2 of 2 positions shown · non-contrast
Comparison: None.

CLINICAL DATA: 64-year-old female with a history of hypertension

EXAM:
CHEST - 2 VIEW

[w chest pa]
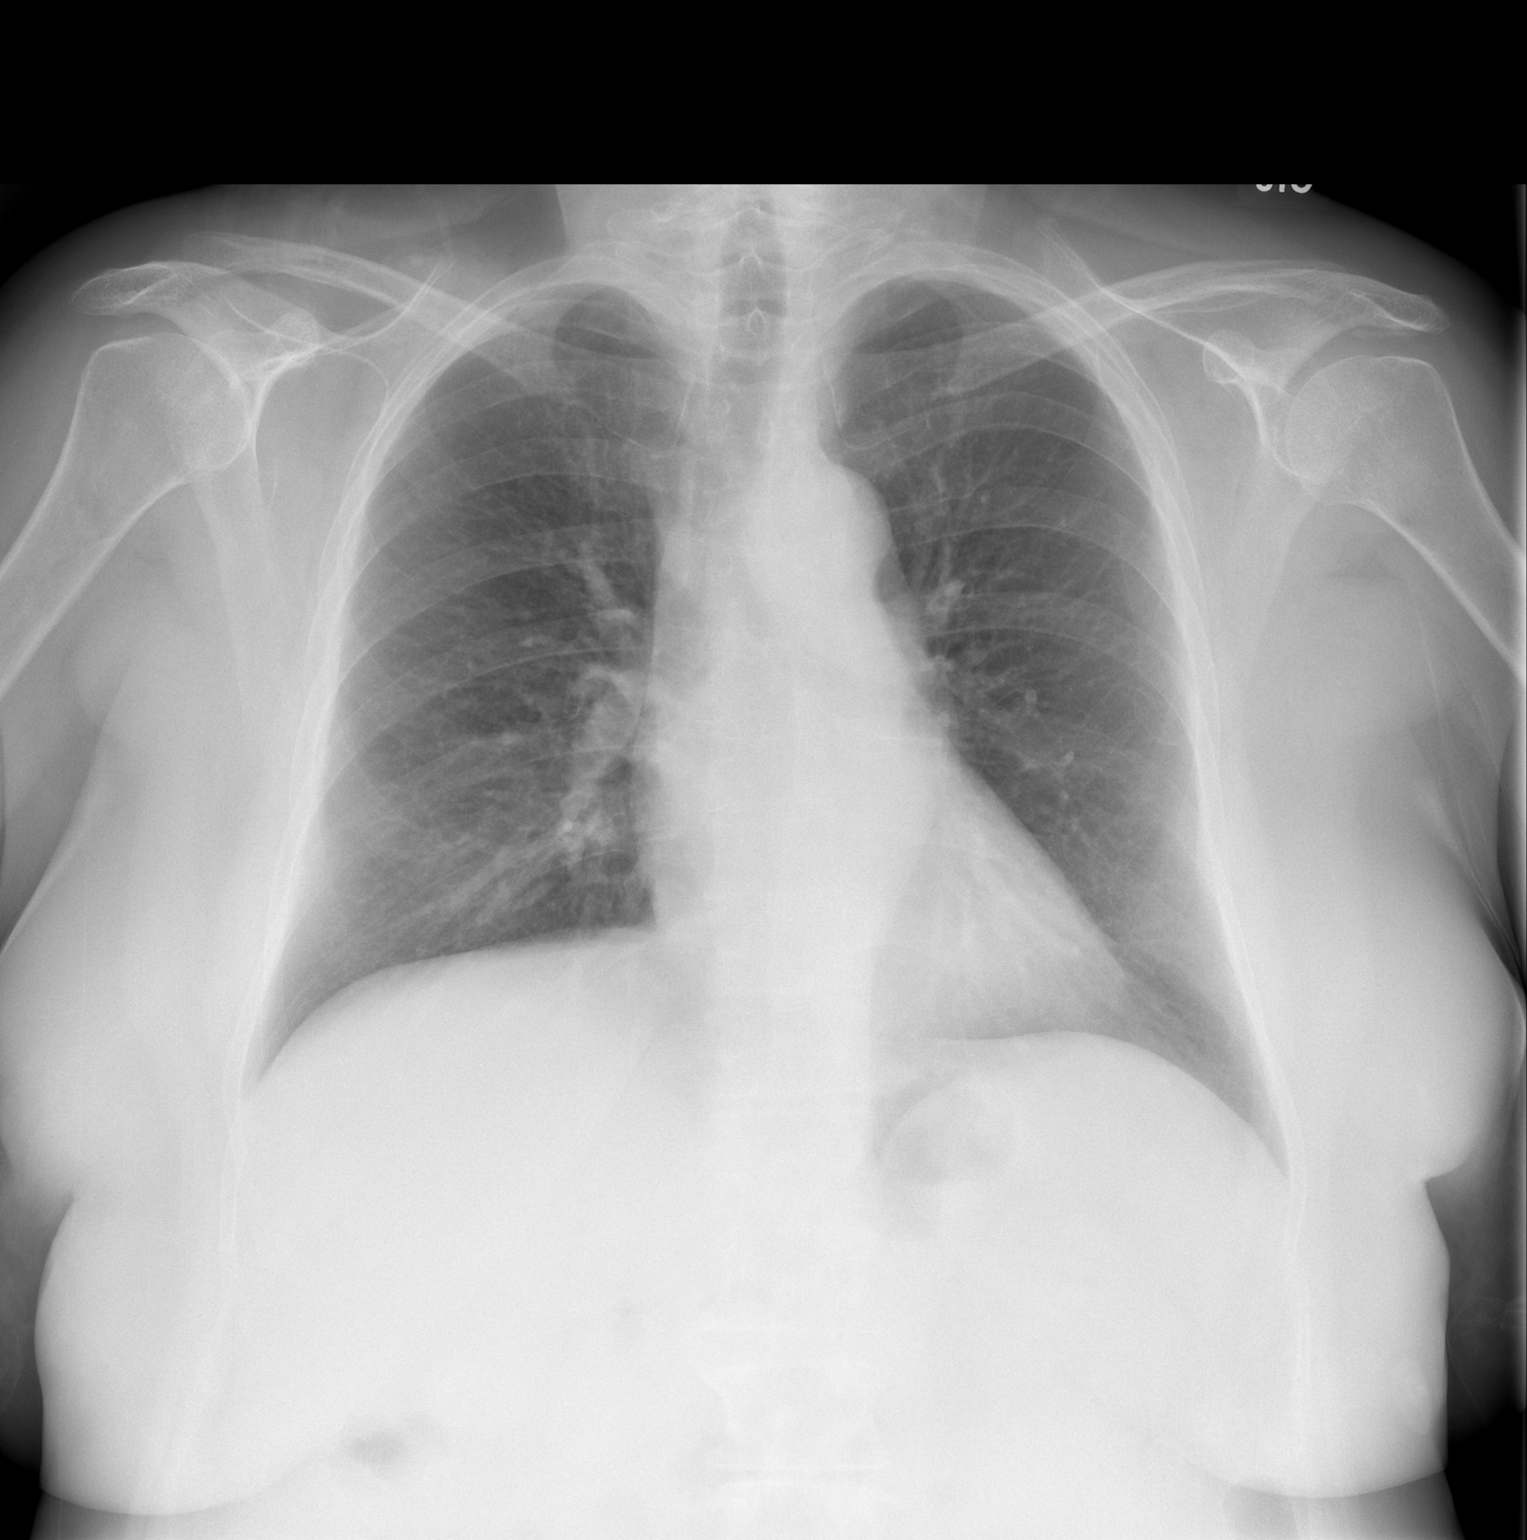

[w chest lat]
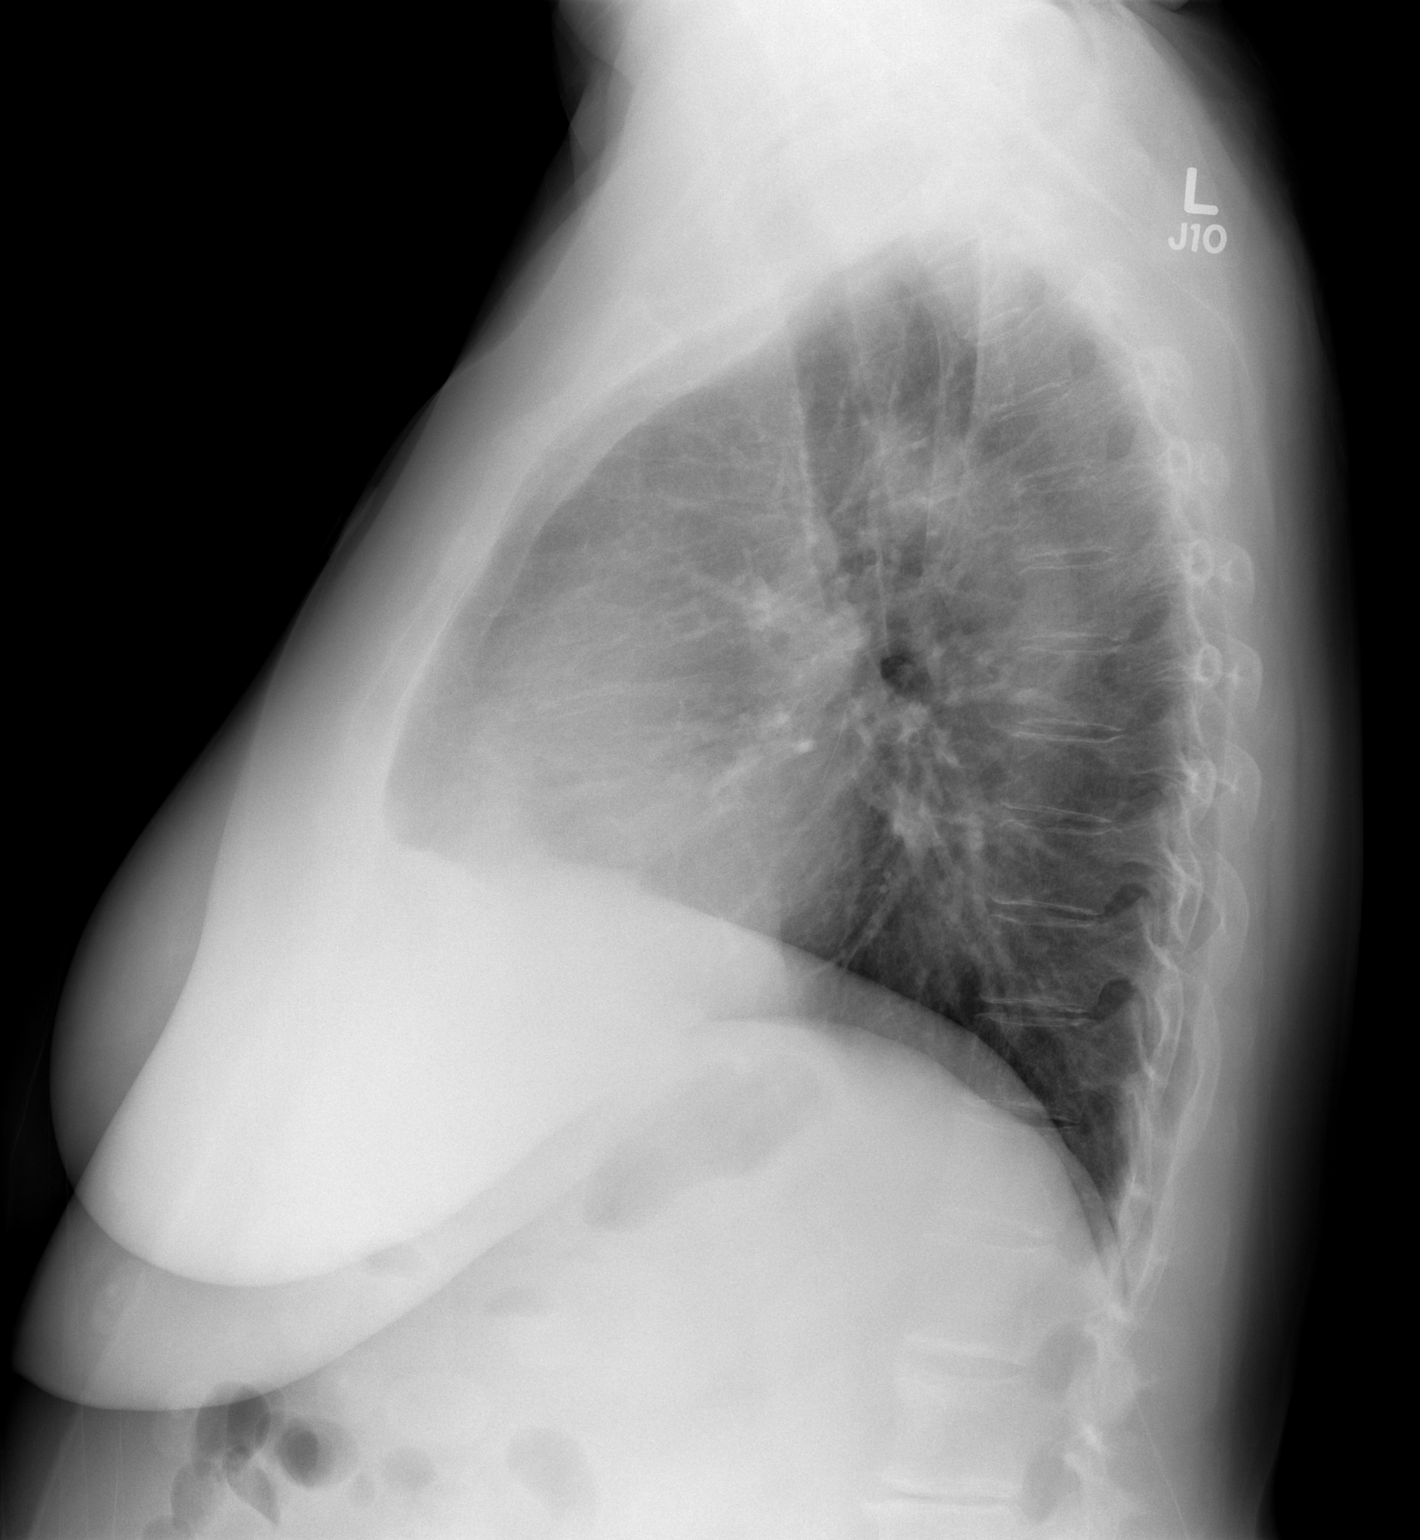

[2 of 2 positions shown; findings below may reference images not displayed]

FINDINGS: Cardiomediastinal silhouette projects within normal limits in size
and contour. No confluent airspace disease, pneumothorax, or pleural
effusion.

No displaced fracture.

Unremarkable appearance of the upper abdomen.
IMPRESSION: No radiographic evidence of acute cardiopulmonary disease.

## 2017-02-13 ENCOUNTER — Encounter (HOSPITAL_BASED_OUTPATIENT_CLINIC_OR_DEPARTMENT_OTHER): Payer: Self-pay | Admitting: Emergency Medicine

## 2017-02-13 ENCOUNTER — Emergency Department (HOSPITAL_BASED_OUTPATIENT_CLINIC_OR_DEPARTMENT_OTHER): Payer: 59

## 2017-02-13 ENCOUNTER — Emergency Department (HOSPITAL_BASED_OUTPATIENT_CLINIC_OR_DEPARTMENT_OTHER)
Admission: EM | Admit: 2017-02-13 | Discharge: 2017-02-14 | Disposition: A | Discharge status: Other Acute Inpatient Hospital | Payer: 59 | Attending: Emergency Medicine | Admitting: Emergency Medicine

## 2017-02-13 DIAGNOSIS — Z9089 Acquired absence of other organs: Secondary | ICD-10-CM | POA: Insufficient documentation

## 2017-02-13 DIAGNOSIS — R791 Abnormal coagulation profile: Secondary | ICD-10-CM | POA: Insufficient documentation

## 2017-02-13 DIAGNOSIS — I10 Essential (primary) hypertension: Secondary | ICD-10-CM | POA: Insufficient documentation

## 2017-02-13 DIAGNOSIS — R221 Localized swelling, mass and lump, neck: Secondary | ICD-10-CM | POA: Diagnosis not present

## 2017-02-13 DIAGNOSIS — Z79899 Other long term (current) drug therapy: Secondary | ICD-10-CM | POA: Insufficient documentation

## 2017-02-13 LAB — BASIC METABOLIC PANEL
Anion gap: 10 (ref 5–15)
BUN: 12 mg/dL (ref 6–20)
CHLORIDE: 102 mmol/L (ref 101–111)
CO2: 22 mmol/L (ref 22–32)
CREATININE: 0.73 mg/dL (ref 0.44–1.00)
Calcium: 8.8 mg/dL — ABNORMAL LOW (ref 8.9–10.3)
Glucose, Bld: 195 mg/dL — ABNORMAL HIGH (ref 65–99)
Potassium: 3.5 mmol/L (ref 3.5–5.1)
SODIUM: 134 mmol/L — AB (ref 135–145)

## 2017-02-13 LAB — CBC WITH DIFFERENTIAL/PLATELET
BASOS ABS: 0 10*3/uL (ref 0.0–0.1)
BASOS PCT: 0 %
Eosinophils Absolute: 0 10*3/uL (ref 0.0–0.7)
Eosinophils Relative: 0 %
HEMATOCRIT: 36.2 % (ref 36.0–46.0)
Hemoglobin: 12 g/dL (ref 12.0–15.0)
LYMPHS PCT: 8 %
Lymphs Abs: 0.8 10*3/uL (ref 0.7–4.0)
MCH: 30.8 pg (ref 26.0–34.0)
MCHC: 33.1 g/dL (ref 30.0–36.0)
MCV: 93.1 fL (ref 78.0–100.0)
Monocytes Absolute: 0.3 10*3/uL (ref 0.1–1.0)
Monocytes Relative: 3 %
NEUTROS ABS: 8.9 10*3/uL — AB (ref 1.7–7.7)
Neutrophils Relative %: 89 %
PLATELETS: 264 10*3/uL (ref 150–400)
RBC: 3.89 MIL/uL (ref 3.87–5.11)
RDW: 12.8 % (ref 11.5–15.5)
WBC: 10.1 10*3/uL (ref 4.0–10.5)

## 2017-02-13 LAB — PROTIME-INR
INR: 1.07
Prothrombin Time: 13.9 seconds (ref 11.4–15.2)

## 2017-02-13 LAB — APTT: APTT: 46 s — AB (ref 24–36)

## 2017-02-13 MED ORDER — DEXAMETHASONE SODIUM PHOSPHATE 10 MG/ML IJ SOLN
10.0000 mg | Freq: Four times a day (QID) | INTRAMUSCULAR | Status: DC
  Administered 2017-02-13: 10 mg via INTRAVENOUS
  Filled 2017-02-13: qty 1

## 2017-02-13 MED ORDER — DIPHENHYDRAMINE HCL 50 MG/ML IJ SOLN
25.0000 mg | Freq: Once | INTRAMUSCULAR | Status: AC
Start: 2017-02-13 — End: 2017-02-13
  Administered 2017-02-13: 25 mg via INTRAVENOUS
  Filled 2017-02-13: qty 1

## 2017-02-13 MED ORDER — ONDANSETRON HCL 4 MG/2ML IJ SOLN
4.0000 mg | Freq: Once | INTRAMUSCULAR | Status: AC
  Administered 2017-02-13: 4 mg via INTRAVENOUS
  Filled 2017-02-13: qty 2

## 2017-02-13 MED ORDER — IOPAMIDOL (ISOVUE-300) INJECTION 61%
80.0000 mL | Freq: Once | INTRAVENOUS | Status: AC | PRN
  Administered 2017-02-13: 80 mL via INTRAVENOUS

## 2017-02-13 MED ORDER — SODIUM CHLORIDE 0.9 % IV BOLUS (SEPSIS)
500.0000 mL | Freq: Once | INTRAVENOUS | Status: AC
Start: 2017-02-13 — End: 2017-02-13
  Administered 2017-02-13: 500 mL via INTRAVENOUS

## 2017-02-13 NOTE — ED Notes (Signed)
Large amount of swelling and bruising noted to the patients neck above the incision site. Family states that the swelling has become progressively more increased and is about 3 -4 times what her normal neck region looks like. They also report noted changes to her speech

## 2017-02-13 NOTE — ED Notes (Signed)
Pt oxygen saturation 92-93% on RA. IV benadryl given and pt falling asleep. Pt placed on 2L supplemental oxygen via . EDP aware.

## 2017-02-13 NOTE — ED Provider Notes (Signed)
[facility] DEPT [facility] Provider Note   CSN: [id number] Arrival date & time: 02/13/17  2108     History   Chief Complaint Chief Complaint  Patient presents with  . Post-op Problem    HPI HEATHERLY WEES is a 66 y.o. female.  HPI Patient had out patient parathyroidectomy today at [facility]. Was discharged home. Has had increasing swelling to the anterior part of the neck since discharge. Family noted the patient was lying flat with ice over the neck. States she has some hoarseness in her voice but denies any shortness of breath. She's had no fever or chills. Called her surgeon and was instructed to come to the emergency department immediately. Past Medical History:  Diagnosis Date  . Heart murmur   . High cholesterol   . Hypertension     There are no active problems to display for this patient.   Past Surgical History:  Procedure Laterality Date  . PARATHYROIDECTOMY      OB History    No data available       Home Medications    Prior to Admission medications   Medication Sig Start Date End Date Taking? Authorizing Provider  telmisartan (MICARDIS) 20 MG tablet Take 20 mg by mouth daily.   Yes Historical Provider, MD  furosemide (LASIX) 20 MG tablet Take 20 mg by mouth.    Historical Provider, MD  hydrochlorothiazide (HYDRODIURIL) 25 MG tablet Take 1 tablet (25 mg total) by mouth daily. [id number]   Delora Fuel, MD    Family History History reviewed. No pertinent family history.  Social History Social History  Substance Use Topics  . Smoking status: Never Smoker  . Smokeless tobacco: Never Used  . Alcohol use No     Allergies   Patient has no known allergies.   Review of Systems Review of Systems  Constitutional: Negative for chills and fever.  HENT: Positive for trouble swallowing and voice change. Negative for facial swelling and sore throat.   Respiratory: Negative for cough, shortness of breath and wheezing.   Cardiovascular: Negative for chest  pain and leg swelling.  Gastrointestinal: Negative for abdominal pain, constipation, diarrhea, nausea and vomiting.  Musculoskeletal: Positive for neck pain. Negative for back pain, myalgias and neck stiffness.  Skin: Positive for wound.  Neurological: Negative for dizziness, weakness, light-headedness, numbness and headaches.  All other systems reviewed and are negative.    Physical Exam Updated Vital Signs BP 150/100   Pulse 95   Temp 97.5 F (36.4 C) (Oral)   Resp 18   Ht 5\' 1"  (1.549 m)   Wt 170 lb (77.1 kg)   SpO2 100%   BMI 32.12 kg/m   Physical Exam  Constitutional: She is oriented to person, place, and time. She appears well-developed and well-nourished. No distress.  HENT:  Head: Normocephalic and atraumatic.  Mouth/Throat: Oropharynx is clear and moist. No oropharyngeal exudate.  Eyes: EOM are normal. Pupils are equal, round, and reactive to light.  Neck: Normal range of motion. Neck supple.  Midline surgical scar with no surrounding erythema or warmth. Patient has ecchymosis to the anterior surface of the neck with swelling extending up to the submandibular region. No stridor appreciated  Cardiovascular: Normal rate and regular rhythm.  Exam reveals no gallop and no friction rub.   No murmur heard. Pulmonary/Chest: Effort normal and breath sounds normal. No respiratory distress. She has no wheezes. She has no rales. She exhibits no tenderness.  Abdominal: Soft. Bowel sounds are normal. There is no  tenderness. There is no rebound and no guarding.  Musculoskeletal: Normal range of motion. She exhibits no edema or tenderness.  No lower extremity swelling, asymmetry or tenderness.  Neurological: She is alert and oriented to person, place, and time.  Skin: Skin is warm and dry. No rash noted. No erythema.  Psychiatric: She has a normal mood and affect. Her behavior is normal.  Nursing note and vitals reviewed.    ED Treatments / Results  Labs (all labs ordered are  listed, but only abnormal results are displayed) Labs Reviewed  CBC WITH DIFFERENTIAL/PLATELET - Abnormal; Notable for the following:       Result Value   Neutro Abs 8.9 (*)    All other components within normal limits  BASIC METABOLIC PANEL - Abnormal; Notable for the following:    Sodium 134 (*)    Glucose, Bld 195 (*)    Calcium 8.8 (*)    All other components within normal limits  APTT - Abnormal; Notable for the following:    aPTT 46 (*)    All other components within normal limits  PROTIME-INR    EKG  EKG Interpretation None       Radiology Ct Soft Tissue Neck W Contrast  Result Date: 02/13/2017 CLINICAL DATA:  Status post parathyroidectomy earlier today. Throat swelling, shortness of breath. EXAM: CT NECK WITH CONTRAST TECHNIQUE: Multidetector CT imaging of the neck was performed using the standard protocol following the bolus administration of intravenous contrast. CONTRAST:  4mL ISOVUE-300 IOPAMIDOL (ISOVUE-300) INJECTION 61% COMPARISON:  None. FINDINGS: Pharynx and larynx: There is a large retropharyngeal effusion of the oropharynx, greatest at the C2- C3 levels, measuring up to 12 mm in AP thickness. This causes mass effect on the pharyngeal airway and supraglottic laryngeal airway. There is a large amount of gas tracking along the left carotid space and in the superficial soft tissues of the left neck. There is a hematoma within the anterior neck soft tissues at the level of the thyroid gland that measures 7.1 x 4.3 cm. The esophagus is patulous and fluid-filled. Salivary glands: The parotid and submandibular glands are normal. No sialolithiasis or salivary ductal dilatation. Thyroid: The thyroid gland itself is normal. As above, there is a large hematoma within the soft tissues anterior to the thyroid. Lymph nodes: No enlarged or abnormal density cervical lymph nodes. Vascular: There is marked narrowing of the left internal jugular vein at the level of the thyroid cartilage,  secondary to mass effect from the adjacent hematoma. The major cervical arteries are widely patent. Limited intracranial: Normal Visualized orbits: Normal Mastoids and visualized paranasal sinuses: Clear. Skeleton: No acute or aggressive process. Upper chest: Clear. Other: None. IMPRESSION: 1. Large retropharyngeal effusion narrowing the oropharyngeal and supraglottic laryngeal airway. The airway remains patent, but is narrowed to approximately 6 mm at the level of the oropharynx. Direct visualization is recommended for complete assessment of possible developing airway compromise. 2. Large hematoma within the soft tissues anterior to the thyroid gland resulting in mass effect on the left internal jugular vein. Postsurgical soft tissue gas tracking along the left carotid space and superficial tissues of the left neck. Electronically Signed   By: Ulyses Jarred M.D.   On: 02/13/2017 23:15    Procedures Procedures (including critical care time)  Medications Ordered in ED Medications  dexamethasone (DECADRON) injection 10 mg (10 mg Intravenous Given 02/13/17 2150)  diphenhydrAMINE (BENADRYL) injection 25 mg (25 mg Intravenous Given 02/13/17 2150)  sodium chloride 0.9 % bolus 500 mL (  0 mLs Intravenous Stopped 02/13/17 2256)  iopamidol (ISOVUE-300) 61 % injection 80 mL (80 mLs Intravenous Contrast Given 02/13/17 2237)  ondansetron (ZOFRAN) injection 4 mg (4 mg Intravenous Given 02/13/17 2307)     Initial Impression / Assessment and Plan / ED Course  I have reviewed the triage vital signs and the nursing notes.  Pertinent labs & imaging results that were available during my care of the patient were reviewed by me and considered in my medical decision making (see chart for details).    Patient denies any worsening of the swelling while in the emergency department. Was given Decadron and Benadryl with little relief. ET with posterior oropharyngeal swelling and anterior cervical swelling. No evidence of  infection. Will discuss with her general surgeon.   Discussed with Dr Genia Hotter, [place] on call. Advised transfer to ED and will eval and admit for observation.  Airway remains stable Final Clinical Impressions(s) / ED Diagnoses   Final diagnoses:  Neck swelling    New Prescriptions New Prescriptions   No medications on file     Julianne Rice, MD 02/13/17 2355

## 2017-02-13 NOTE — ED Triage Notes (Signed)
Patient has a parathyroidectomy earlier today  - patient states that since she was released she has had swelling to her throat. Patient reports that she is becoming SOB  but reports that she is having trouble swallowing as well and nausea

## 2017-02-14 DIAGNOSIS — Z9089 Acquired absence of other organs: Secondary | ICD-10-CM | POA: Diagnosis not present

## 2017-02-14 DIAGNOSIS — R791 Abnormal coagulation profile: Secondary | ICD-10-CM | POA: Diagnosis not present

## 2017-02-14 DIAGNOSIS — R221 Localized swelling, mass and lump, neck: Secondary | ICD-10-CM | POA: Diagnosis present

## 2017-02-14 DIAGNOSIS — I10 Essential (primary) hypertension: Secondary | ICD-10-CM | POA: Diagnosis not present

## 2017-02-14 DIAGNOSIS — Z79899 Other long term (current) drug therapy: Secondary | ICD-10-CM | POA: Diagnosis not present

## 2017-02-14 NOTE — ED Notes (Addendum)
Pt reports some sternal discomfort "like I have to burp". EDP notified, EKG ordered and completed. NAD noted.

## 2017-02-22 ENCOUNTER — Emergency Department (HOSPITAL_BASED_OUTPATIENT_CLINIC_OR_DEPARTMENT_OTHER)
Admission: EM | Admit: 2017-02-22 | Discharge: 2017-02-23 | Disposition: A | Discharge status: Home / Self Care | Payer: 59 | Attending: Emergency Medicine | Admitting: Emergency Medicine

## 2017-02-22 ENCOUNTER — Encounter (HOSPITAL_BASED_OUTPATIENT_CLINIC_OR_DEPARTMENT_OTHER): Payer: Self-pay | Admitting: Emergency Medicine

## 2017-02-22 DIAGNOSIS — L7622 Postprocedural hemorrhage and hematoma of skin and subcutaneous tissue following other procedure: Secondary | ICD-10-CM | POA: Insufficient documentation

## 2017-02-22 DIAGNOSIS — Z09 Encounter for follow-up examination after completed treatment for conditions other than malignant neoplasm: Secondary | ICD-10-CM

## 2017-02-22 DIAGNOSIS — I10 Essential (primary) hypertension: Secondary | ICD-10-CM | POA: Diagnosis not present

## 2017-02-22 HISTORY — DX: Hyperparathyroidism, unspecified: E21.3

## 2017-02-22 HISTORY — DX: Hypercalcemia: E83.52

## 2017-02-22 NOTE — ED Triage Notes (Signed)
Parathyroid surgery 2/19 at [facility]. F/u appt in 2 days. Noted minute amt of blood from site this morning.  This evening there is dried blood (approx quarter size) on the bandage and pt concerned because she has not had any bleeding since the surgery.

## 2017-02-23 MED ORDER — "THROMBI-PAD 3""X3"" EX PADS"
MEDICATED_PAD | CUTANEOUS | Status: AC
  Administered 2017-02-23: 1
  Filled 2017-02-23: qty 1

## 2017-02-23 NOTE — Discharge Instructions (Signed)
Keep dressing on your wound until your followup with your surgeon on Friday for follow up.  Please return to the Emergency Department if symptoms worsen or new onset of fever, facial/neck swelling, difficulty breathing, worsening bleeding, cough, coughing up blood, vomiting, chest pain.

## 2017-02-23 NOTE — ED Provider Notes (Signed)
[facility] DEPT [facility] Provider Note   CSN: [id number] Arrival date & time: 02/22/17  2335     History   Chief Complaint Chief Complaint  Patient presents with  . Blood from surgical site    HPI Lori Cohen is a 66 y.o. female.  HPI   Patient is a 66 year old female who presents the ED with reported blood from surgical site. Patient reports having parathyroid surgery performed on 2/19 at [facility]. Initially seen in the ED at [facility] the next day due to significant swelling and bruising to her neck. Patient states she was admitted to the hospital at [facility] and discharged home on 2/21. She reports earlier today noticing a small amount of blood on her dressing. She notes her neck swelling and bruising have significantly improved since being discharged home from the hospital. Denies fever, chills, cough, difficulty breathing, wheezing, vomiting, redness or purulent drainage. Patient denies any associated pain. She reports having a follow-up appointment with her surgeon, Dr. Genia Hotter, scheduled for 02/24/17.  Past Medical History:  Diagnosis Date  . Heart murmur   . High cholesterol   . Hypercalcemia   . Hyperparathyroidism ([place])   . Hypertension     There are no active problems to display for this patient.   Past Surgical History:  Procedure Laterality Date  . PARATHYROIDECTOMY      OB History    No data available       Home Medications    Prior to Admission medications   Medication Sig Start Date End Date Taking? Authorizing Provider  calcium-vitamin D (OSCAL WITH D) 500-200 MG-UNIT tablet Take 2 tablets by mouth.   Yes Historical Provider, MD  furosemide (LASIX) 20 MG tablet Take 20 mg by mouth.    Historical Provider, MD  hydrochlorothiazide (HYDRODIURIL) 25 MG tablet Take 1 tablet (25 mg total) by mouth daily. [id number]   Delora Fuel, MD  telmisartan (MICARDIS) 20 MG tablet Take 20 mg by mouth daily.    Historical Provider,  MD    Family History No family history on file.  Social History Social History  Substance Use Topics  . Smoking status: Never Smoker  . Smokeless tobacco: Never Used  . Alcohol use No     Allergies   Patient has no known allergies.   Review of Systems Review of Systems  Skin: Positive for wound (bleeding).  All other systems reviewed and are negative.    Physical Exam Updated Vital Signs BP 165/89 (BP Location: Left Arm)   Pulse 113   Temp 98.8 F (37.1 C) (Oral)   Resp 16   Ht 5\' 1"  (1.549 m)   Wt 76.2 kg   SpO2 97%   BMI 31.74 kg/m   Physical Exam  Constitutional: She is oriented to person, place, and time. She appears well-developed and well-nourished. No distress.  HENT:  Head: Normocephalic and atraumatic.    Mouth/Throat: Uvula is midline, oropharynx is clear and moist and mucous membranes are normal. No oral lesions. No trismus in the jaw. No uvula swelling. No oropharyngeal exudate, posterior oropharyngeal edema, posterior oropharyngeal erythema or tonsillar abscesses. No tonsillar exudate.  Eyes: Conjunctivae and EOM are normal. Right eye exhibits no discharge. Left eye exhibits no discharge. No scleral icterus.  Neck: Normal range of motion. Neck supple. No tracheal tenderness, no spinous process tenderness and no muscular tenderness present. Normal range of motion present.  Swelling noted to anterior neck with well-healing midline surgical incision present  with steri-strips. Small amount of dried blood noted to steri-strips and small amount of blood present on nonadhesive dressing. No active bleeding. Significant ecchymoses present to anterior neck and anterior upper chest/breasts. No TTP. FROM of neck without reported pain.   Cardiovascular: Normal rate, regular rhythm, normal heart sounds and intact distal pulses.   Pulmonary/Chest: Effort normal and breath sounds normal. No stridor. No respiratory distress. She has no wheezes. She has no rales. She  exhibits no tenderness.  Abdominal: Soft. She exhibits no distension.  Musculoskeletal: She exhibits no edema.  Lymphadenopathy:    She has no cervical adenopathy.  Neurological: She is alert and oriented to person, place, and time.  Skin: Skin is warm and dry. She is not diaphoretic.  Nursing note and vitals reviewed.    ED Treatments / Results  Labs (all labs ordered are listed, but only abnormal results are displayed) Labs Reviewed - No data to display  EKG  EKG Interpretation None       Radiology No results found.  Procedures Procedures (including critical care time)  Medications Ordered in ED Medications  THROMBI-PAD (THROMBI-PAD) 3"X3" pad (1 each  Given 02/23/17 0036)     Initial Impression / Assessment and Plan / ED Course  I have reviewed the triage vital signs and the nursing notes.  Pertinent labs & imaging results that were available during my care of the patient were reviewed by me and considered in my medical decision making (see chart for details).     Pt presents with reported bleeding from surgical site. She reports having parathyroid surgery preformed at [facility] on 2/19, and reports having small amount of blood present on dressing which she noticed today. Denies fever, SOB, cough, CP, neck pain/stiffness, vomiting. VSS. Exam revealed moderate swelling to anterior neck around midline surgical site with small amount of dried blood present on steri-strips and nonadhesive dressing. No active bleeding. No stridor. Full range of motion of neck without tenderness. Airway patent. Lungs clear to auscultation bilaterally. Patient remains hemodynamically stable while in the ED. Fibrin pad applied in the ED with new dressing. Advised pt to follow up with surgeon at scheduled appointment on 02/24/17.   Final Clinical Impressions(s) / ED Diagnoses   Final diagnoses:  Postop check    New Prescriptions New Prescriptions   No medications on file     Nona Dell, PA-C 02/23/17 0038    Shanon Rosser, MD 02/23/17 [phone]

## 2017-09-28 DIAGNOSIS — M8580 Other specified disorders of bone density and structure, unspecified site: Secondary | ICD-10-CM | POA: Insufficient documentation

## 2017-09-28 DIAGNOSIS — E559 Vitamin D deficiency, unspecified: Secondary | ICD-10-CM | POA: Insufficient documentation

## 2017-09-28 DIAGNOSIS — Z8639 Personal history of other endocrine, nutritional and metabolic disease: Secondary | ICD-10-CM | POA: Insufficient documentation

## 2017-09-28 DIAGNOSIS — E782 Mixed hyperlipidemia: Secondary | ICD-10-CM | POA: Insufficient documentation

## 2017-09-28 DIAGNOSIS — I10 Essential (primary) hypertension: Secondary | ICD-10-CM | POA: Insufficient documentation

## 2018-05-12 IMAGING — CT CT NECK W/ CM
4 of 5 series · 14 of 33 positions shown, 16 images · IV contrast (iopamidol)
Comparison: None.

CLINICAL DATA: Status post parathyroidectomy earlier today. Throat
swelling, shortness of breath.

EXAM:
CT NECK WITH CONTRAST
TECHNIQUE: Multidetector CT imaging of the neck was performed using the
standard protocol following the bolus administration of intravenous
contrast.
CONTRAST:  80mL U1RMHG-CUU IOPAMIDOL (U1RMHG-CUU) INJECTION 61%

[Series 3: axial neck · axial · 0.44mm/px · z∈[-262,-214]mm · 2 of 119 slices shown]
[im 24/119  bone]
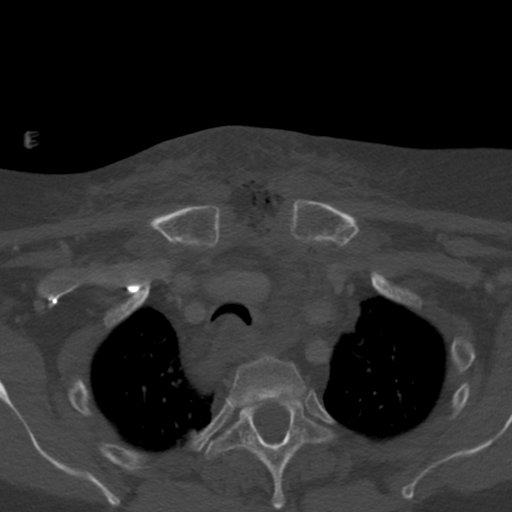
[im 48/119  bone]
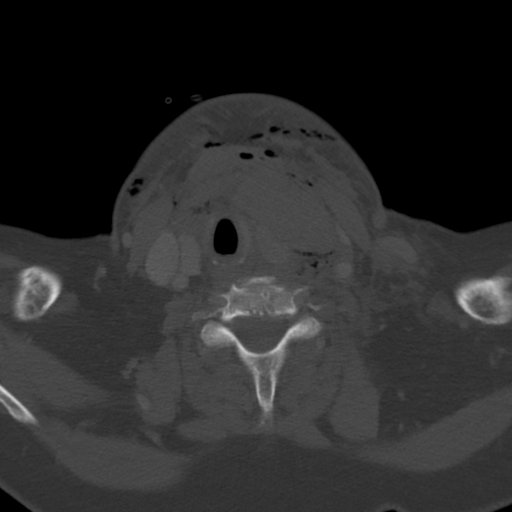

[Series 7: sag neck · sagittal · 0.47mm/px · 5 of 95 slices shown, 6 images]
[im 32/95  bone]
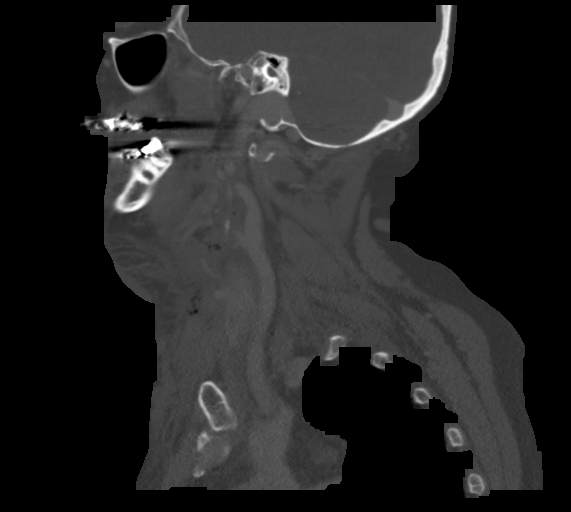
[im 40/95  bone]
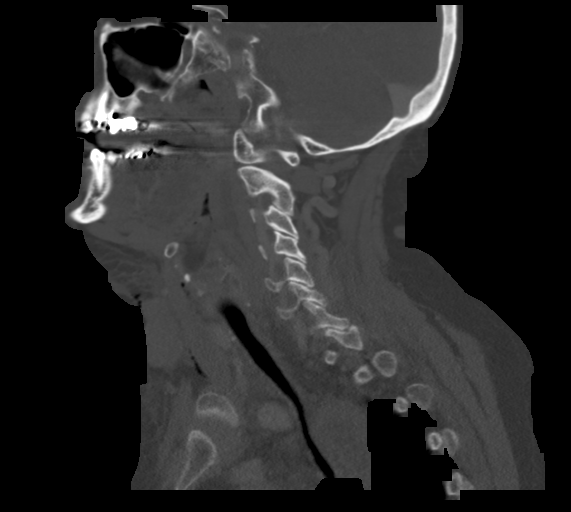
[im 48/95  soft-tissue]
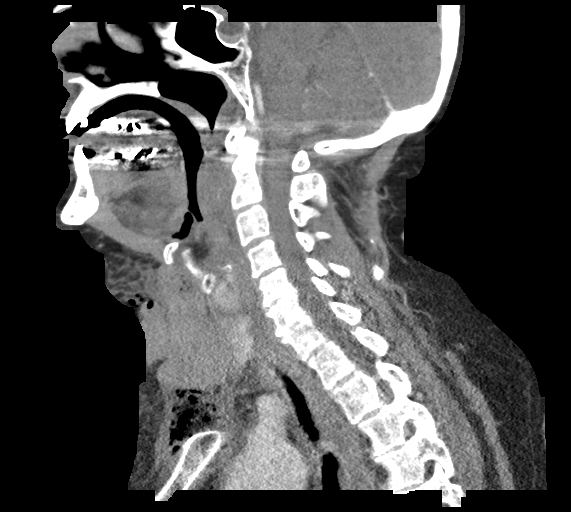
[im 48/95  bone]
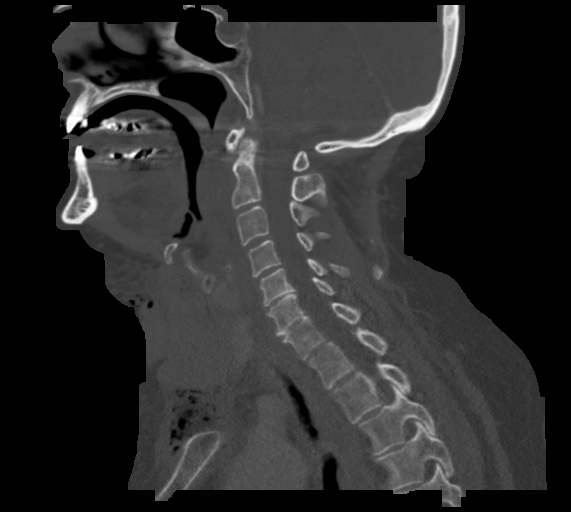
[im 55/95  bone]
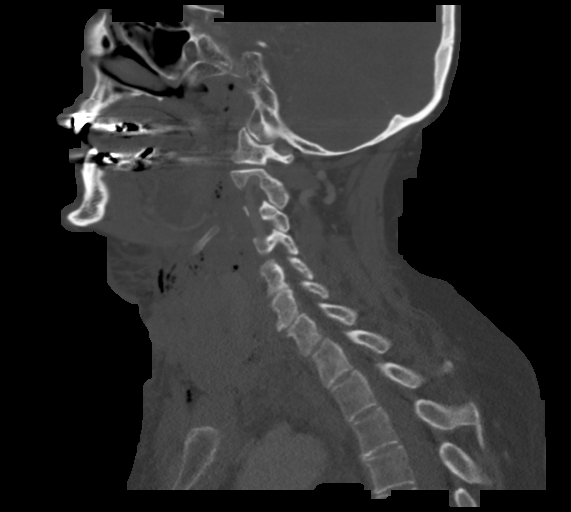
[im 63/95  bone]
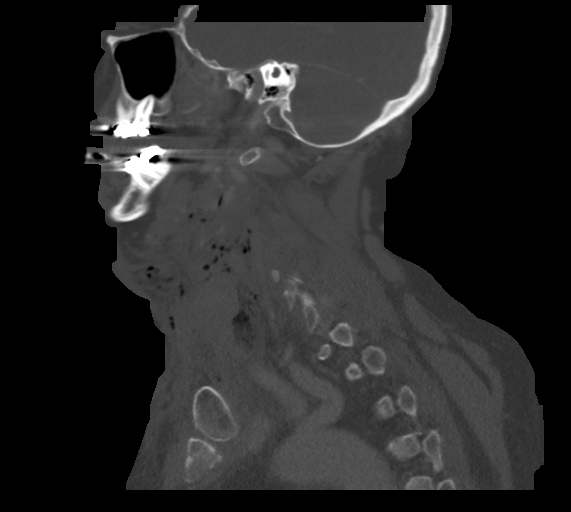

[Series 8: cor neck · coronal · 0.58mm/px · 3 of 99 slices shown]
[im 21/99  bone]
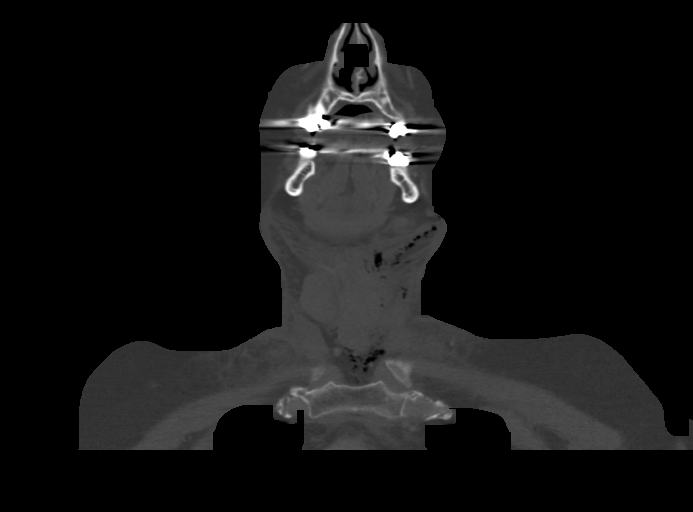
[im 40/99  bone]
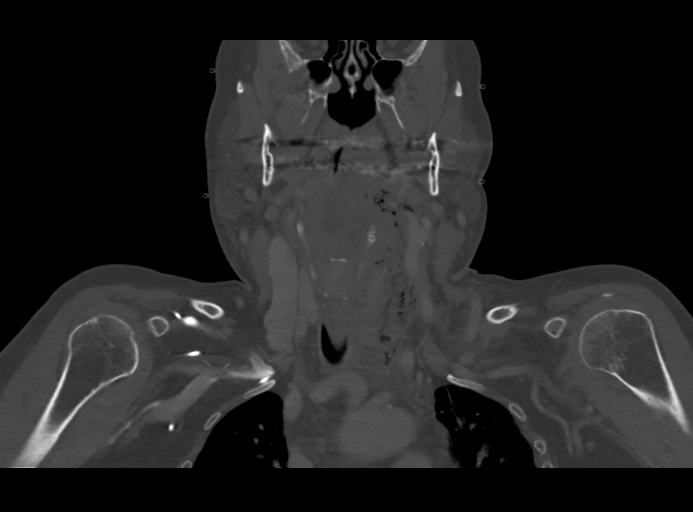
[im 59/99  bone]
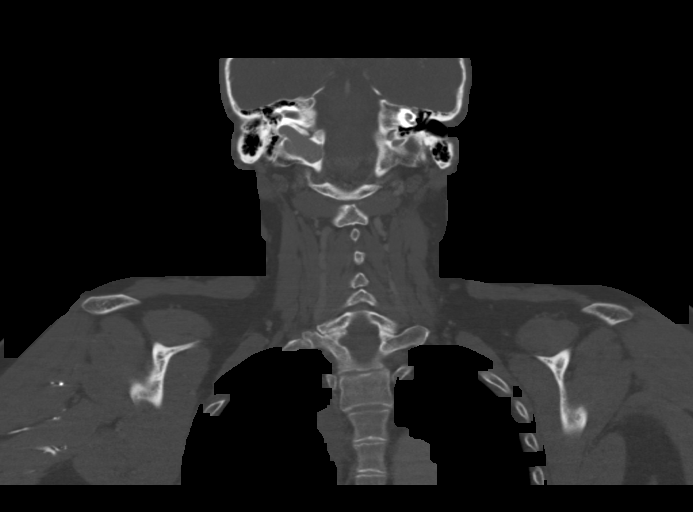

[Series 9: orthogonal ax · axial · 0.46mm/px · z∈[-300,-142]mm · 4 of 135 slices shown, 5 images]
[im 27/135  soft-tissue]
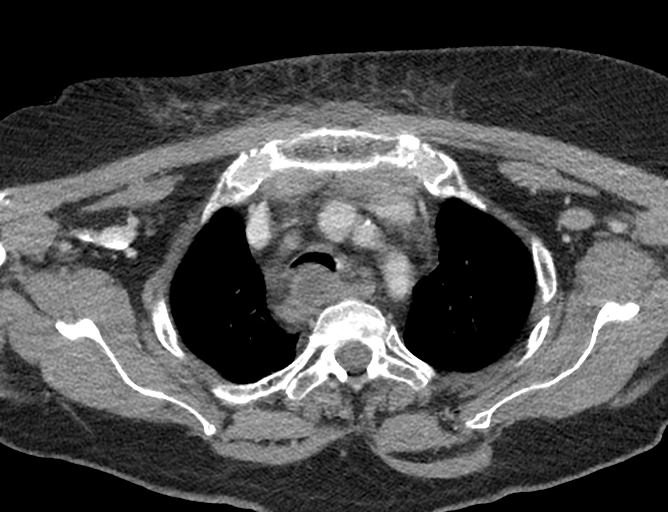
[im 27/135  bone]
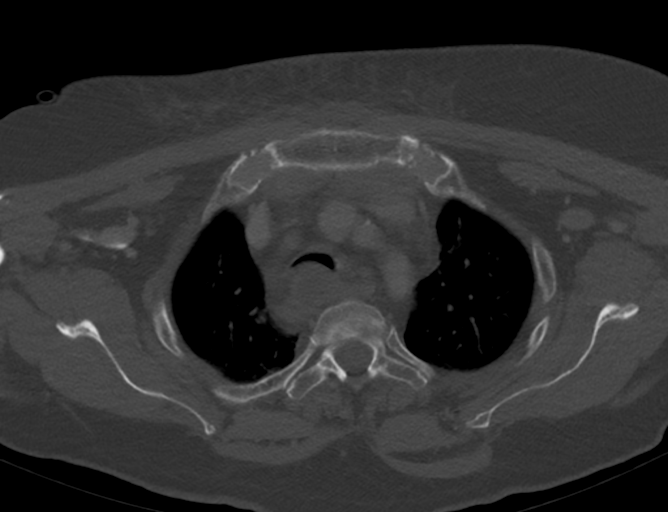
[im 54/135  bone]
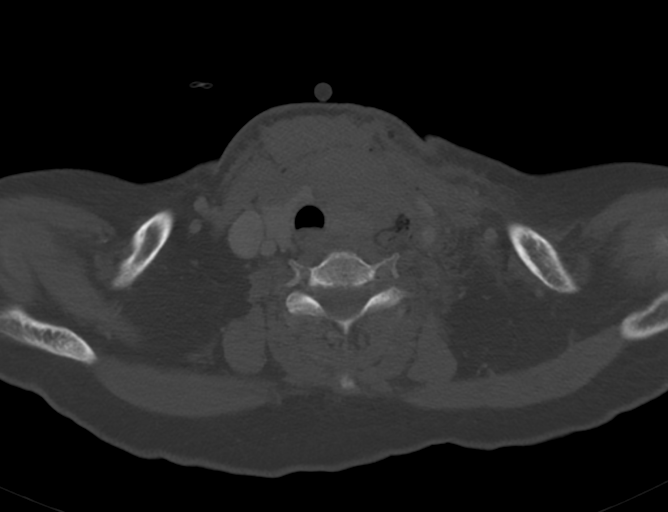
[im 81/135  bone]
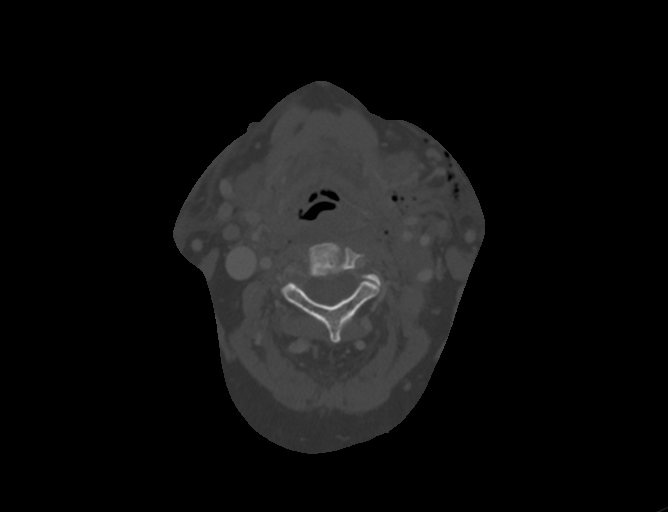
[im 108/135  bone]
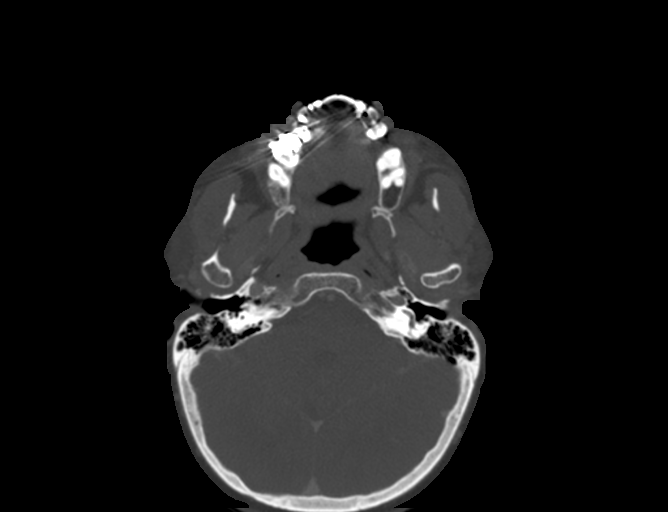

[14 of 33 positions shown; findings below may reference images not displayed]

FINDINGS: Pharynx and larynx: There is a large retropharyngeal effusion of the
oropharynx, greatest at the C2- C3 levels, measuring up to 12 mm in
AP thickness. This causes mass effect on the pharyngeal airway and
supraglottic laryngeal airway. There is a large amount of gas
tracking along the left carotid space and in the superficial soft
tissues of the left neck. There is a hematoma within the anterior
neck soft tissues at the level of the thyroid gland that measures
7.1 x 4.3 cm. The esophagus is patulous and fluid-filled.

Salivary glands: The parotid and submandibular glands are normal. No
sialolithiasis or salivary ductal dilatation.

Thyroid: The thyroid gland itself is normal. As above, there is a
large hematoma within the soft tissues anterior to the thyroid.

Lymph nodes: No enlarged or abnormal density cervical lymph nodes.

Vascular: There is marked narrowing of the left internal jugular
vein at the level of the thyroid cartilage, secondary to mass effect
from the adjacent hematoma. The major cervical arteries are widely
patent.

Limited intracranial: Normal

Visualized orbits: Normal

Mastoids and visualized paranasal sinuses: Clear.

Skeleton: No acute or aggressive process.

Upper chest: Clear.

Other: None.
IMPRESSION: 1. Large retropharyngeal effusion narrowing the oropharyngeal and
supraglottic laryngeal airway. The airway remains patent, but is
narrowed to approximately 6 mm at the level of the oropharynx.
Direct visualization is recommended for complete assessment of
possible developing airway compromise.
2. Large hematoma within the soft tissues anterior to the thyroid
gland resulting in mass effect on the left internal jugular vein.
Postsurgical soft tissue gas tracking along the left carotid space
and superficial tissues of the left neck.

## 2020-10-18 ENCOUNTER — Encounter (HOSPITAL_BASED_OUTPATIENT_CLINIC_OR_DEPARTMENT_OTHER): Payer: Self-pay | Admitting: Emergency Medicine

## 2020-10-18 ENCOUNTER — Other Ambulatory Visit: Payer: Self-pay

## 2020-10-18 ENCOUNTER — Emergency Department (HOSPITAL_BASED_OUTPATIENT_CLINIC_OR_DEPARTMENT_OTHER)
Admission: EM | Admit: 2020-10-18 | Discharge: 2020-10-18 | Disposition: A | Discharge status: Home / Self Care | Payer: Medicare Other | Attending: Emergency Medicine | Admitting: Emergency Medicine

## 2020-10-18 ENCOUNTER — Emergency Department (HOSPITAL_BASED_OUTPATIENT_CLINIC_OR_DEPARTMENT_OTHER): Payer: Medicare Other

## 2020-10-18 DIAGNOSIS — Z79899 Other long term (current) drug therapy: Secondary | ICD-10-CM | POA: Diagnosis not present

## 2020-10-18 DIAGNOSIS — S0083XA Contusion of other part of head, initial encounter: Secondary | ICD-10-CM | POA: Insufficient documentation

## 2020-10-18 DIAGNOSIS — Y9301 Activity, walking, marching and hiking: Secondary | ICD-10-CM | POA: Diagnosis not present

## 2020-10-18 DIAGNOSIS — S0990XA Unspecified injury of head, initial encounter: Secondary | ICD-10-CM | POA: Diagnosis present

## 2020-10-18 DIAGNOSIS — W19XXXA Unspecified fall, initial encounter: Secondary | ICD-10-CM

## 2020-10-18 DIAGNOSIS — I10 Essential (primary) hypertension: Secondary | ICD-10-CM | POA: Diagnosis not present

## 2020-10-18 DIAGNOSIS — W01198A Fall on same level from slipping, tripping and stumbling with subsequent striking against other object, initial encounter: Secondary | ICD-10-CM | POA: Insufficient documentation

## 2020-10-18 NOTE — ED Notes (Signed)
Pt denies need for xray of left shoulder

## 2020-10-18 NOTE — ED Triage Notes (Signed)
Pt arrives pov with c/o fall on Friday evening. Pt endorses falling into railing and hit head on frontal left side. Pt denies loc, bruising note bilaterally to eyes and abrasions noted to left side of face. Pt denies dizziness. Pt also c/o left shoulder pain. GCS 15 in  triage

## 2020-10-18 NOTE — ED Provider Notes (Signed)
[facility] EMERGENCY DEPARTMENT Provider Note   CSN: [id number] Arrival date & time: 10/18/20  1633     History Chief Complaint  Patient presents with  . Fall    Lori Cohen is a 69 y.o. female.  She had a trip and fall couple of days ago and struck her head on a railing.  There was no loss of consciousness.  She has applied ice to the bruising on her head.  She is noticed over the last few days she has had more ecchymosis over her face and some swelling around her eyes.  She called the nursing line who recommended she come in to the emergency department.  No real headache no dizziness no unbalanced.  No change in vision or hearing.  Not on any blood thinners.  The history is provided by the patient.  Head Injury Location:  Frontal Mechanism of injury: fall   Fall:    Fall occurred:  Walking   Impact surface:  [employer] of impact:  Head   Entrapped after fall: no   Pain details:    Quality:  Dull   Severity:  Mild   Duration:  3 days   Progression:  Resolved Relieved by:  Ice Worsened by:  Nothing Ineffective treatments:  None tried Associated symptoms: no blurred vision, no double vision, no focal weakness, no headaches, no hearing loss, no loss of consciousness, no nausea, no neck pain, no numbness and no vomiting        Past Medical History:  Diagnosis Date  . Heart murmur   . High cholesterol   . Hypercalcemia   . Hyperparathyroidism ([place])   . Hypertension     There are no problems to display for this patient.   Past Surgical History:  Procedure Laterality Date  . PARATHYROIDECTOMY       OB History   No obstetric history on file.     History reviewed. No pertinent family history.  Social History   Tobacco Use  . Smoking status: Never Smoker  . Smokeless tobacco: Never Used  Substance Use Topics  . Alcohol use: No  . Drug use: No    Home Medications Prior to Admission medications   Medication Sig Start Date End Date  Taking? Authorizing Provider  calcium-vitamin D (OSCAL WITH D) 500-200 MG-UNIT tablet Take 2 tablets by mouth.    [provider]  furosemide (LASIX) 20 MG tablet Take 20 mg by mouth.    [provider]  hydrochlorothiazide (HYDRODIURIL) 25 MG tablet Take 1 tablet (25 mg total) by mouth daily. 77/82/42   Delora Fuel, MD  telmisartan (MICARDIS) 20 MG tablet Take 20 mg by mouth daily.    [provider]    Allergies    Patient has no known allergies.  Review of Systems   Review of Systems  Constitutional: Negative for fever.  HENT: Positive for facial swelling. Negative for hearing loss and sore throat.   Eyes: Negative for blurred vision, double vision and visual disturbance.  Respiratory: Negative for shortness of breath.   Cardiovascular: Negative for chest pain.  Gastrointestinal: Negative for abdominal pain, nausea and vomiting.  Genitourinary: Negative for dysuria.  Musculoskeletal: Negative for neck pain.  Skin: Positive for wound. Negative for rash.  Neurological: Negative for focal weakness, loss of consciousness, numbness and headaches.    Physical Exam Updated Vital Signs BP 119/90 (BP Location: Right Arm)   Pulse 68   Temp 98.1 F (36.7 C) (Oral)  Resp 18   Ht 5\' 2"  (1.575 m)   Wt 63.5 kg   SpO2 98%   BMI 25.61 kg/m   Physical Exam Vitals and nursing note reviewed.  Constitutional:      General: She is not in acute distress.    Appearance: Normal appearance. She is well-developed.  HENT:     Head: Normocephalic.     Comments: She has a small hematoma over her frontal scalp.  She is got some dependent edema and ecchymosis around her eyes.  Few abrasions lateral to her left eye. Eyes:     Extraocular Movements: Extraocular movements intact.     Conjunctiva/sclera: Conjunctivae normal.     Pupils: Pupils are equal, round, and reactive to light.  Cardiovascular:     Rate and Rhythm: Normal rate and regular rhythm.     Heart sounds:  No murmur heard.   Pulmonary:     Effort: Pulmonary effort is normal. No respiratory distress.     Breath sounds: Normal breath sounds.  Abdominal:     Palpations: Abdomen is soft.     Tenderness: There is no abdominal tenderness.  Musculoskeletal:        General: No deformity or signs of injury.     Cervical back: Normal range of motion and neck supple.  Skin:    General: Skin is warm and dry.     Capillary Refill: Capillary refill takes less than 2 seconds.  Neurological:     General: No focal deficit present.     Mental Status: She is alert.     ED Results / Procedures / Treatments   Labs (all labs ordered are listed, but only abnormal results are displayed) Labs Reviewed - No data to display  EKG None  Radiology CT Head Wo Contrast  Result Date: 10/18/2020 CLINICAL DATA:  69 year old female with continued headache following fall and head injury 2 days ago. EXAM: CT HEAD WITHOUT CONTRAST TECHNIQUE: Contiguous axial images were obtained from the base of the skull through the vertex without intravenous contrast. COMPARISON:  None. FINDINGS: Brain: No evidence of acute infarction, hemorrhage, hydrocephalus, extra-axial collection or mass lesion/mass effect. Vascular: No hyperdense vessel or unexpected calcification. Skull: Normal. Negative for fracture or focal lesion. Sinuses/Orbits: No acute finding. Other: LEFT anterior scalp soft tissue swelling/hematoma noted. IMPRESSION: 1. No evidence of acute intracranial abnormality. 2. LEFT anterior scalp soft tissue swelling/hematoma without fracture. Electronically Signed   By: Margarette Canada M.D.   On: 10/18/2020 17:11    Procedures Procedures (including critical care time)  Medications Ordered in ED Medications - No data to display  ED Course  I have reviewed the triage vital signs and the nursing notes.  Pertinent labs & imaging results that were available during my care of the patient were reviewed by me and considered in my  medical decision making (see chart for details).    MDM Rules/Calculators/A&P                         69 year old female not on anticoagulation here after mechanical fall striking her head that occurred 2 days ago.  She is mostly concerned that she has new bruising to her face.  Minimal pain.  CT head ordered and interpreted by me as soft tissue swelling, no intracranial bleed.  She is awake and alert.  I discussed the natural expected course of this.  She is comfortable with plan for observing her symptoms at home.  Differential includes  skull fracture, intracranial hematoma, scalp hematoma Final Clinical Impression(s) / ED Diagnoses Final diagnoses:  Fall, initial encounter  Injury of head, initial encounter  Contusion of face, initial encounter    Rx / DC Orders ED Discharge Orders    None       Hayden Rasmussen, MD 10/19/20 1008

## 2020-10-18 NOTE — Discharge Instructions (Signed)
You were seen in the emergency department for bruising around your face and eyes after a fall a few days ago.  This is likely the scalp hematoma that gravity is causing the bruising around her eyes.  This will go away over time.  Ice may be helpful.  Follow-up with your doctor or return to the emergency department if any worsening or concerning symptoms

## 2021-10-28 ENCOUNTER — Other Ambulatory Visit: Payer: Self-pay

## 2021-10-28 ENCOUNTER — Emergency Department (HOSPITAL_BASED_OUTPATIENT_CLINIC_OR_DEPARTMENT_OTHER)
Admission: EM | Admit: 2021-10-28 | Discharge: 2021-10-28 | Disposition: A | Discharge status: Home / Self Care | Payer: Medicare Other | Attending: Emergency Medicine | Admitting: Emergency Medicine

## 2021-10-28 ENCOUNTER — Encounter (HOSPITAL_BASED_OUTPATIENT_CLINIC_OR_DEPARTMENT_OTHER): Payer: Self-pay | Admitting: Emergency Medicine

## 2021-10-28 DIAGNOSIS — T171XXA Foreign body in nostril, initial encounter: Secondary | ICD-10-CM | POA: Diagnosis not present

## 2021-10-28 DIAGNOSIS — R040 Epistaxis: Secondary | ICD-10-CM | POA: Insufficient documentation

## 2021-10-28 DIAGNOSIS — I10 Essential (primary) hypertension: Secondary | ICD-10-CM | POA: Diagnosis not present

## 2021-10-28 DIAGNOSIS — Z79899 Other long term (current) drug therapy: Secondary | ICD-10-CM | POA: Diagnosis not present

## 2021-10-28 DIAGNOSIS — X58XXXA Exposure to other specified factors, initial encounter: Secondary | ICD-10-CM | POA: Insufficient documentation

## 2021-10-28 MED ORDER — OXYMETAZOLINE HCL 0.05 % NA SOLN
1.0000 | Freq: Once | NASAL | Status: AC
  Administered 2021-10-28: 1 via NASAL
  Filled 2021-10-28: qty 30

## 2021-10-28 MED ORDER — LIDOCAINE-EPINEPHRINE (PF) 2 %-1:200000 IJ SOLN
10.0000 mL | Freq: Once | INTRAMUSCULAR | Status: AC
  Administered 2021-10-28: 10 mL via INTRADERMAL
  Filled 2021-10-28: qty 20

## 2021-10-28 NOTE — ED Provider Notes (Signed)
[facility] EMERGENCY DEPARTMENT Provider Note   CSN: [id number] Arrival date & time: 10/28/21  6387     History Chief Complaint  Patient presents with   Epistaxis    Lori Cohen is a 70 y.o. female.  70 yo F with a chief complaints of right-sided epistaxis.  This is been going off and on since she had cautery performed yesterday.  She has had some transient improvement of getting it to stop but however was unable to get it stopped most recently and so came here for evaluation.  She denies recurrent trauma.  Denies fevers.  The history is provided by the patient.  Epistaxis Location:  R nare Severity:  Moderate Duration:  2 hours Timing:  Constant Progression:  Unchanged Chronicity:  New Relieved by:  Nothing Worsened by:  Nothing Ineffective treatments:  None tried Associated symptoms: blood in oropharynx   Associated symptoms: no congestion, no dizziness, no fever and no headaches       Past Medical History:  Diagnosis Date   Heart murmur    High cholesterol    Hypercalcemia    Hyperparathyroidism ([place])    Hypertension     There are no problems to display for this patient.   Past Surgical History:  Procedure Laterality Date   PARATHYROIDECTOMY       OB History   No obstetric history on file.     History reviewed. No pertinent family history.  Social History   Tobacco Use   Smoking status: Never   Smokeless tobacco: Never  Vaping Use   Vaping Use: Never used  Substance Use Topics   Alcohol use: No   Drug use: No    Home Medications Prior to Admission medications   Medication Sig Start Date End Date Taking? Authorizing Provider  calcium-vitamin D (OSCAL WITH D) 500-200 MG-UNIT tablet Take 2 tablets by mouth.    [provider]  furosemide (LASIX) 20 MG tablet Take 20 mg by mouth.    [provider]  hydrochlorothiazide (HYDRODIURIL) 25 MG tablet Take 1 tablet (25 mg total) by mouth daily. 56/43/32   Delora Fuel, MD  telmisartan (MICARDIS) 20 MG tablet Take 20 mg by mouth daily.    [provider]    Allergies    Patient has no known allergies.  Review of Systems   Review of Systems  Constitutional:  Negative for chills and fever.  HENT:  Positive for nosebleeds. Negative for congestion and rhinorrhea.   Eyes:  Negative for redness and visual disturbance.  Respiratory:  Negative for shortness of breath and wheezing.   Cardiovascular:  Negative for chest pain and palpitations.  Gastrointestinal:  Negative for nausea and vomiting.  Genitourinary:  Negative for dysuria and urgency.  Musculoskeletal:  Negative for arthralgias and myalgias.  Skin:  Negative for pallor and wound.  Neurological:  Negative for dizziness and headaches.   Physical Exam Updated Vital Signs BP (!) 158/94 (BP Location: Left Arm)   Pulse (!) 120   Temp 98.7 F (37.1 C) (Oral)   Resp (!) 26   Ht 5\' 2"  (1.575 m)   Wt 59 kg   SpO2 96%   BMI 23.78 kg/m   Physical Exam Vitals and nursing note reviewed.  Constitutional:      General: She is not in acute distress.    Appearance: She is well-developed. She is not diaphoretic.  HENT:     Head: Normocephalic and atraumatic.     Comments: Blood noted  to the right naris.  Some clots along the medial aspect with some surrounding active oozing. Eyes:     Pupils: Pupils are equal, round, and reactive to light.  Cardiovascular:     Rate and Rhythm: Normal rate and regular rhythm.     Heart sounds: No murmur heard.   No friction rub. No gallop.  Pulmonary:     Effort: Pulmonary effort is normal.     Breath sounds: No wheezing or rales.  Abdominal:     General: There is no distension.     Palpations: Abdomen is soft.     Tenderness: There is no abdominal tenderness.  Musculoskeletal:        General: No tenderness.     Cervical back: Normal range of motion and neck supple.  Skin:    General: Skin is warm and dry.  Neurological:     Mental Status:  She is alert and oriented to person, place, and time.  Psychiatric:        Behavior: Behavior normal.    ED Results / Procedures / Treatments   Labs (all labs ordered are listed, but only abnormal results are displayed) Labs Reviewed - No data to display  EKG None  Radiology No results found.  Procedures .Epistaxis Management  Date/Time: 10/28/2021 6:46 AM Performed by: Deno Etienne, DO Authorized by: Deno Etienne, DO   Consent:    Consent obtained:  Verbal   Consent given by:  Patient   Risks, benefits, and alternatives were discussed: yes     Risks discussed:  Bleeding, infection, nasal injury and pain   Alternatives discussed:  No treatment, delayed treatment and alternative treatment Universal protocol:    Procedure explained and questions answered to patient or proxy's satisfaction: yes     Patient identity confirmed:  Verbally with patient Anesthesia:    Anesthesia method:  Topical application   Topical anesthesia: lidocaine with epinepherine atomized. Comments:     Cotton pledget soaked in Afrin and lidocaine with epinephrine placed into the right naris with pressure applied.   Medications Ordered in ED Medications  lidocaine-EPINEPHrine (XYLOCAINE W/EPI) 2 %-1:200000 (PF) injection 10 mL (10 mLs Intradermal Given by Other 10/28/21 0622)  oxymetazoline (AFRIN) 0.05 % nasal spray 1 spray (1 spray Each Nare Given by Other 10/28/21 7425)    ED Course  I have reviewed the triage vital signs and the nursing notes.  Pertinent labs & imaging results that were available during my care of the patient were reviewed by me and considered in my medical decision making (see chart for details).    MDM Rules/Calculators/A&P                           70 yo F with a chief complaints of right-sided epistaxis.  This is been bleeding off and on since she had a cauterization done yesterday by the ENT.  I attempted direct pressure with the cotton soaked gauze to the right naris.  When  I came back to recheck on the patient and she told me that she felt like it was coming out and so she pushed it further back.  I am unable to fully visualize it or remove it on repeat exam.  I discussed this with Dr. Laurance Flatten, ENT recommended sending her to the office this morning and they will try to address the bleeding and foreign body.  6:48 AM:  I have discussed the diagnosis/risks/treatment options with the patient and believe  the pt to be eligible for discharge home to follow-up with ENT. We also discussed returning to the ED immediately if new or worsening sx occur. We discussed the sx which are most concerning (e.g., sudden worsening pain, fever, inability to tolerate by mouth) that necessitate immediate return. Medications administered to the patient during their visit and any new prescriptions provided to the patient are listed below.  Medications given during this visit Medications  lidocaine-EPINEPHrine (XYLOCAINE W/EPI) 2 %-1:200000 (PF) injection 10 mL (10 mLs Intradermal Given by Other 10/28/21 0622)  oxymetazoline (AFRIN) 0.05 % nasal spray 1 spray (1 spray Each Nare Given by Other 10/28/21 6384)     The patient appears reasonably screen and/or stabilized for discharge and I doubt any other medical condition or other [facility] requiring further screening, evaluation, or treatment in the ED at this time prior to discharge.   Final Clinical Impression(s) / ED Diagnoses Final diagnoses:  Epistaxis  Nasal foreign body, initial encounter    Rx / DC Orders ED Discharge Orders     None        Deno Etienne, DO 10/28/21 [phone]

## 2021-10-28 NOTE — ED Triage Notes (Signed)
Pt states she had a cauterization Wednesday around 11am  in [facility] by an ENT  Pt state she had a nosebleed earlier Wednesday evening but was able to get it to quit but about an hour and a half ago it started bleeding again and she cannot get it to quit  EMS came out tonight and instructed her to come in

## 2021-10-28 NOTE — Discharge Instructions (Signed)
Call the office this morning and head over to the clinic so they can help you stop your bleeding and retrieve the cotton!

## 2022-06-23 ENCOUNTER — Other Ambulatory Visit (HOSPITAL_COMMUNITY): Payer: Self-pay | Admitting: Obstetrics & Gynecology

## 2022-06-23 ENCOUNTER — Other Ambulatory Visit: Payer: Self-pay | Admitting: Obstetrics & Gynecology

## 2022-06-23 DIAGNOSIS — N950 Postmenopausal bleeding: Secondary | ICD-10-CM

## 2022-07-12 ENCOUNTER — Ambulatory Visit
Admission: RE | Admit: 2022-07-12 | Discharge: 2022-07-12 | Disposition: A | Discharge status: Home / Self Care | Payer: Medicare Other | Source: Ambulatory Visit | Attending: Obstetrics & Gynecology | Admitting: Obstetrics & Gynecology

## 2022-07-12 DIAGNOSIS — N950 Postmenopausal bleeding: Secondary | ICD-10-CM | POA: Diagnosis present

## 2022-07-20 ENCOUNTER — Telehealth: Payer: Self-pay | Admitting: *Deleted

## 2022-07-20 NOTE — Telephone Encounter (Signed)
Spoke with the patient regarding the referral to GYN oncology. Patient scheduled as new patient with Dr Berline Lopes on 8/14 at 10:30 am. Patient given an arrival time of 10 am.  Explained to the patient the the doctor will perform a pelvic exam at this visit. Patient given the policy that no visitors under the 16 yrs are a loud in the [place]. Patient given the address/phone number for the clinic and that the center offers free valet service.

## 2022-08-04 ENCOUNTER — Encounter: Payer: Self-pay | Admitting: Gynecologic Oncology

## 2022-08-07 ENCOUNTER — Encounter: Payer: Self-pay | Admitting: Gynecologic Oncology

## 2022-08-07 NOTE — Progress Notes (Signed)
GYNECOLOGIC ONCOLOGY NEW PATIENT CONSULTATION   Patient Name: Lori Cohen  Patient Age: 71 y.o. Date of Service: 08/08/22 Referring Provider: Dr. Vania Rea  Primary Care Provider: Kristopher Glee., MD Consulting Provider: Jeral Pinch, MD   Assessment/Plan:  Postmenopausal patient with very slow growth of bilateral mostly cystic adnexal masses, recent episode of postmenopausal bleeding.  I reviewed with the patient and her husband imaging findings on ultrasound dating back to 2015.  Per her report, original ultrasound was done in the setting of a limited episode of postmenopausal bleeding.  There has been very slow growth (approximately 3 cm) of the larger adnexal cyst, now measuring up to 10 cm.  We looked at her most recent imaging study together and I reviewed the overall simple appearance of both adnexal cysts.  There is a thin internal septation within the smaller cyst.  There are no imaging findings that raise significant concern for malignancy.  Given the size of the larger mass and its slow growth, I think it is reasonable to consider surgery for excision.  We discussed the risk of torsion given the size of the ovary.  I have asked her to call me if she develops any significant, acute pain from now until surgery.  We also discussed her limited postmenopausal bleeding in May.  Her ultrasound from July shows a thin endometrial lining.  This is reassuring.  Most likely, her bleeding is related to atrophy.  Given plan for surgery, I would recommend endometrial sampling to assure no need for procedure involving the uterus if bilateral ovaries are benign.  We discussed that endometrial biopsy could be performed today or could be performed at the start of the surgery and sent for frozen section.  In the unlikely event that endometrial hyperplasia was found, total hysterectomy would be performed.  If malignancy identified, and the uterus would be sent for frozen section after total  hysterectomy to help determine whether additional lymph node evaluation was indicated.  After discussing these possibilities, the patient wishes to proceed with endometrial sampling at the time of surgery.  Discussed plan for robotic bilateral salpingo-oophorectomy.  Both adnexa will be removed and placed in Endo Catch bags for contained cyst drainage and removal.  If benign on frozen section, then no additional surgery will be performed.  If borderline ovarian tumor identified, then we discussed additional staging including +/- hysterectomy, peritoneal biopsies, and omentectomy.  If malignancy is encountered, then lymph node sampling would also be performed.  Discussed her recent CA-125.  This is not a test approved for diagnosis as it can be normal in up to 50% of early ovarian cancers and elevated in many noncancerous disease processes.  We will reach out to her primary care provider about clearance.  She has a history of a murmur although I do not appreciate one on her exam today.  She has seen cardiology in the past.  Previously took amoxicillin before dental procedures.  Patient and her husband have a trip scheduled to the [place] in September.  We will schedule surgery after they return on the 23rd. the patient will return closer to the date of surgery for perioperative teaching.  A copy of this note was sent to the patient's referring provider.   70 minutes of total time was spent for this patient encounter, including preparation, face-to-face counseling with the patient and coordination of care, and documentation of the encounter.   Jeral Pinch, MD  Division of Gynecologic Oncology  Department of Obstetrics and  Gynecology  [facility]  ___________________________________________  Chief Complaint: Chief Complaint  Patient presents with   Adnexal mass    History of Present Illness:  Lori Cohen is a 71 y.o. y.o. female who is seen in  consultation at the request of Dr. Stann Mainland for an evaluation of bilateral adnexal masses.  Patient endorses having postmenopausal bleeding back in 2015.  She had an ultrasound at that point for work-up of her postmenopausal bleeding.  Endometrium was 1.3 mm but she was was noted to have incidental bilateral simple appearing adnexal cyst.  Pelvic ultrasound at [facility] [facility] on 06/04/2014: Simple cyst noted bilaterally, left measures up to 2 cm and right up to 7.1 cm. Pelvic ultrasound exam at [place] on 01/03/2015 was bilateral simple cysts, left measuring up to 1.6 cm and right up to 6.8 cm.  CA-125 in 03/2020: 9.3.  CA-125 on 06/27/2022 was 8.1.  Pelvic ultrasound on 7/18: Uterus measures 4.8 x 1.4 x 3.8 cm.  Endometrial lining is 2 mm.  Right ovary measures 10 x 7 x 9 cm with an anechoic structure.  No internal septations or mural nodules.  Left ovary measures up to 3.8 cm.  There is a 3 x 2 x 4 cm cyst with thin internal septation.  No free fluid.  Patient had 2 days of very light vaginal spotting in May this year.  This was her second episode of postmenopausal bleeding.  She denies any further bleeding since.  She denies any pelvic or abdominal pain.  She reports regular bowel and bladder function.  She endorses a good appetite without nausea or emesis.  PAST MEDICAL HISTORY:  Past Medical History:  Diagnosis Date   Heart murmur    High cholesterol    History of Hypercalcemia    resolved with surgery   history of Hyperparathyroidism ([place])    s/p surgery   Hypertension    Low vitamin D level    Migraines    history of migraines prior to menopause, now rare   Osteopenia      PAST SURGICAL HISTORY:  Past Surgical History:  Procedure Laterality Date   PARATHYROIDECTOMY      OB/GYN HISTORY:  OB History  Gravida Para Term Preterm AB Living  2 2          SAB IAB Ectopic Multiple Live Births               # Outcome Date GA Lbr Len/2nd Weight Sex Delivery Anes PTL Lv   2 Para           1 Para             No LMP recorded. Patient is postmenopausal.  Age at menarche: 40 Age at menopause: 77s Hx of HRT: Denies Hx of STDs: Denies Last pap: 03/2020 negative, HR HPV not performed; 06/22/2022-negative, high-risk HPV not performed History of abnormal pap smears: denies  SCREENING STUDIES:  Last mammogram: 05/2022  Last colonoscopy: 02/2019 Last bone mineral density: 2021  MEDICATIONS: Outpatient Encounter Medications as of 08/08/2022  Medication Sig   acetaminophen (TYLENOL) 500 MG tablet Take by mouth.   amoxicillin (AMOXIL) 500 MG capsule Take 500 mg by mouth as needed. For dental appts only- takes 4 tablets before (2,'000mg'$ )   calcium-vitamin D (OSCAL WITH D) 500-5 MG-MCG tablet Take 1 tablet by mouth. 2,000 IU daily   fexofenadine (ALLEGRA) 180 MG tablet Take by mouth.   Lactobacillus-Inulin (CULTURELLE DIGESTIVE DAILY PO) Take  by mouth.   Menaquinone-7 (VITAMIN K2) 100 MCG CAPS    OVER THE COUNTER MEDICATION Take 250 mg by mouth daily. Kyolic aged garlic 1/2 tablet   oxymetazoline (NASAL RELIEF) 0.05 % nasal spray Place into the nose.   telmisartan (MICARDIS) 20 MG tablet Take 20 mg by mouth daily.   [DISCONTINUED] calcium-vitamin D (OSCAL WITH D) 500-200 MG-UNIT tablet Take 2 tablets by mouth.   [DISCONTINUED] furosemide (LASIX) 20 MG tablet Take 20 mg by mouth.   [DISCONTINUED] hydrochlorothiazide (HYDRODIURIL) 25 MG tablet Take 1 tablet (25 mg total) by mouth daily.   No facility-administered encounter medications on file as of 08/08/2022.    ALLERGIES:  No Known Allergies   FAMILY HISTORY:  Family History  Problem Relation Age of Onset   Breast cancer Mother        Rare form of breast cancer- didnt require any chemo/rad- per pt   Ovarian cysts Mother        removed, no further f/u   Colon cancer Neg Hx    Endometrial cancer Neg Hx    Ovarian cancer Neg Hx    Pancreatic cancer Neg Hx    Prostate cancer Neg Hx      SOCIAL  HISTORY:  Social Connections: Not on file    REVIEW OF SYSTEMS:  + bruising easily Denies appetite changes, fevers, chills, fatigue, unexplained weight changes. Denies hearing loss, neck lumps or masses, mouth sores, ringing in ears or voice changes. Denies cough or wheezing.  Denies shortness of breath. Denies chest pain or palpitations. Denies leg swelling. Denies abdominal distention, pain, blood in stools, constipation, diarrhea, nausea, vomiting, or early satiety. Denies pain with intercourse, dysuria, frequency, hematuria or incontinence. Denies hot flashes, pelvic pain, vaginal bleeding or vaginal discharge.   Denies joint pain, back pain or muscle pain/cramps. Denies itching, rash, or wounds. Denies dizziness, headaches, numbness or seizures. Denies swollen lymph nodes or glands, denies easy bleeding. Denies anxiety, depression, confusion, or decreased concentration.  Physical Exam:  Vital Signs for this encounter:  Blood pressure 137/79, pulse 93, temperature 98.4 F (36.9 C), temperature source Oral, resp. rate 16, height '5\' 1"'$  (1.549 m), weight 129 lb (58.5 kg), SpO2 100 %. Body mass index is 24.37 kg/m. General: Alert, oriented, no acute distress.  HEENT: Normocephalic, atraumatic. Sclera anicteric.  Chest: Clear to auscultation bilaterally. No wheezes, rhonchi, or rales. Cardiovascular: Regular rate and rhythm, no murmurs, rubs, or gallops.  Abdomen: Normoactive bowel sounds. Soft, nondistended, nontender to palpation. No masses or hepatosplenomegaly appreciated. No palpable fluid wave.  Extremities: Grossly normal range of motion. Warm, well perfused. No edema bilaterally.  Skin: No rashes or lesions.  Lymphatics: No cervical, supraclavicular, or inguinal adenopathy.  GU:  Normal external female genitalia. No lesions. No discharge or bleeding.             Bladder/urethra:  No lesions or masses, well supported bladder             Vagina: Mildly atrophic, no lesions or  masses.             Cervix: Normal appearing, no lesions.             Uterus: Small, mobile, no parametrial involvement or nodularity.             Adnexa: Large, approximately 10 cm, smooth and mobile mass fills the cul-de-sac in the midline into the right.  No nodularity appreciated.  Rectal: Deferred.  LABORATORY AND RADIOLOGIC DATA:  Outside medical  records were reviewed to synthesize the above history, along with the history and physical obtained during the visit.   Lab Results  Component Value Date   WBC 10.1 02/13/2017   HGB 12.0 02/13/2017   HCT 36.2 02/13/2017   PLT 264 02/13/2017   GLUCOSE 195 (H) 02/13/2017   ALT 11 (L) 10/12/2015   AST 21 10/12/2015   NA 134 (L) 02/13/2017   K 3.5 02/13/2017   CL 102 02/13/2017   CREATININE 0.73 02/13/2017   BUN 12 02/13/2017   CO2 22 02/13/2017   INR 1.07 02/13/2017

## 2022-08-08 ENCOUNTER — Encounter: Payer: Self-pay | Admitting: Gynecologic Oncology

## 2022-08-08 ENCOUNTER — Other Ambulatory Visit: Payer: Self-pay

## 2022-08-08 ENCOUNTER — Inpatient Hospital Stay: Payer: Medicare Other | Attending: Gynecologic Oncology | Admitting: Gynecologic Oncology

## 2022-08-08 VITALS — BP 137/79 | HR 93 | Temp 98.4°F | Resp 16 | Ht 61.0 in | Wt 129.0 lb

## 2022-08-08 DIAGNOSIS — Z79899 Other long term (current) drug therapy: Secondary | ICD-10-CM | POA: Diagnosis not present

## 2022-08-08 DIAGNOSIS — E7800 Pure hypercholesterolemia, unspecified: Secondary | ICD-10-CM | POA: Insufficient documentation

## 2022-08-08 DIAGNOSIS — I10 Essential (primary) hypertension: Secondary | ICD-10-CM | POA: Diagnosis not present

## 2022-08-08 DIAGNOSIS — R011 Cardiac murmur, unspecified: Secondary | ICD-10-CM | POA: Diagnosis not present

## 2022-08-08 DIAGNOSIS — N9489 Other specified conditions associated with female genital organs and menstrual cycle: Secondary | ICD-10-CM | POA: Insufficient documentation

## 2022-08-08 DIAGNOSIS — M8580 Other specified disorders of bone density and structure, unspecified site: Secondary | ICD-10-CM | POA: Insufficient documentation

## 2022-08-08 DIAGNOSIS — N950 Postmenopausal bleeding: Secondary | ICD-10-CM

## 2022-08-08 DIAGNOSIS — D398 Neoplasm of uncertain behavior of other specified female genital organs: Secondary | ICD-10-CM | POA: Diagnosis present

## 2022-08-08 DIAGNOSIS — Z8679 Personal history of other diseases of the circulatory system: Secondary | ICD-10-CM | POA: Insufficient documentation

## 2022-08-08 DIAGNOSIS — E559 Vitamin D deficiency, unspecified: Secondary | ICD-10-CM | POA: Diagnosis not present

## 2022-08-08 NOTE — Patient Instructions (Signed)
We will tentatively plan for surgery at [facility] with Dr. Jeral Pinch on September 21, 2022. We will see you back in the office closer to the date for a preop appointment with Joylene John NP to discuss the instrcutions for before and after surgery.  You may also receive a phone call from the hospital to arrange for a pre-op appointment there as well. Usually both appointments can be combined on the same day.  We will have you scheduled for dilation and curettage of the uterus (dilating the cervix and taking a sampling of lining of uterus), robotic assisted laparoscopic bilateral salpingo-oophorectomy (removal of both ovaries and fallopian tubes through small incisions), possible robotic assisted total hysterectomy (removal of the uterus and cervix), possible staging if a cancer is identified.

## 2022-09-13 DIAGNOSIS — R011 Cardiac murmur, unspecified: Secondary | ICD-10-CM

## 2022-09-13 HISTORY — DX: Cardiac murmur, unspecified: R01.1

## 2022-09-13 NOTE — Patient Instructions (Signed)
DUE TO COVID-19 ONLY TWO VISITORS  (aged 70 and older)  ARE ALLOWED TO COME WITH YOU AND STAY IN THE WAITING ROOM ONLY DURING PRE OP AND PROCEDURE.   **NO VISITORS ARE ALLOWED IN THE SHORT STAY AREA OR RECOVERY ROOM!!**  IF YOU WILL BE ADMITTED INTO THE HOSPITAL YOU ARE ALLOWED ONLY FOUR SUPPORT PEOPLE DURING VISITATION HOURS ONLY (7 AM -8PM)   The support person(s) must pass our screening, gel in and out, and wear a mask at all times, including in the patient's room. Patients must also wear a mask when staff or their support person are in the room. Visitors GUEST BADGE MUST BE WORN VISIBLY  One adult visitor may remain with you overnight and MUST be in the room by 8 P.M.     Your procedure is scheduled on: 09/21/22   Report to [facility] Main Entrance    Report to admitting at 8:00 AM   Call this number if you have problems the morning of surgery [phone]  Light Diet- Full liquid diet  the day before surgery  Strained creamy soups Tea, Coffee- with cream or mild and sugar or honey  Juices- cranberry , grape and apple  Jello  Milkshakes  Pudding , custards  Popsicles  Water Plain ice cream f, frozen yogurt, sherbet, plain yogurt  Fruit ices and popsicles with no fruit pulp  Sugar, honey and syrups Clear broths  Boost, Ensure, Resource and other liquid supplements NO CARBONATED BEVERAGES     Do not eat food :After Midnight.   After Midnight you may have water  until _7:00 _____ AM/  DAY OF SURGERY            If you have questions, please contact your surgeon's office.   FOLLOW BOWEL PREP AND ANY ADDITIONAL PRE OP INSTRUCTIONS YOU RECEIVED FROM YOUR SURGEON'S OFFICE!!!     Oral Hygiene is also important to reduce your risk of infection.                                    Remember - BRUSH YOUR TEETH THE MORNING OF SURGERY WITH YOUR REGULAR TOOTHPASTE   Do NOT smoke after Midnight   Take these medicines the morning of surgery with A SIP OF WATER:  none                                You may not have any metal on your body including hair pins, jewelry, and body piercing             Do not wear make-up, lotions, powders, perfumes/cologne, or deodorant  Do not wear nail polish including gel and S&S, artificial/acrylic nails, or any other type of covering on natural nails including finger and toenails. If you have artificial nails, gel coating, etc. that needs to be removed by a nail salon please have this removed prior to surgery or surgery may need to be canceled/ delayed if the surgeon/ anesthesia feels like they are unable to be safely monitored.   Do not shave  48 hours prior to surgery.    Do not bring valuables to the hospital. [place].   Contacts, dentures or bridgework may not be worn into surgery.   Bring  small overnight bag day of surgery.   DO NOT [place].    Patients discharged on the day of surgery will not be allowed to drive home.  Someone NEEDS to stay with you for the first 24 hours after anesthesia.   Special Instructions: Bring a copy of your healthcare power of attorney and living will documents  the day of surgery if you haven't scanned them before.              Please read over the following fact sheets you were given: IF YOU HAVE QUESTIONS ABOUT YOUR PRE-OP INSTRUCTIONS PLEASE CALL [phone]     [facility] - Preparing for Surgery Before surgery, you can play an important role.  Because skin is not sterile, your skin needs to be as free of germs as possible.  You can reduce the number of germs on your skin by washing with CHG (chlorahexidine gluconate) soap before surgery.  CHG is an antiseptic cleaner which kills germs and bonds with the skin to continue killing germs even after washing. Please DO NOT use if you have an allergy to CHG or antibacterial soaps.  If your skin becomes reddened/irritated stop using the CHG and  inform your nurse when you arrive at Short Stay. Do not shave (including legs and underarms) for at least 48 hours prior to the first CHG shower.   Please follow these instructions carefully:  1.  Shower with CHG Soap the night before surgery and the  morning of Surgery.  2.  If you choose to wash your hair, wash your hair first as usual with your  normal  shampoo.  3.  After you shampoo, rinse your hair and body thoroughly to remove the  shampoo.                            4.  Use CHG as you would any other liquid soap.  You can apply chg directly  to the skin and wash                       Gently with a scrungie or clean washcloth.  5.  Apply the CHG Soap to your body ONLY FROM THE NECK DOWN.   Do not use on face/ open                           Wound or open sores. Avoid contact with eyes, ears mouth and genitals (private parts).                       Wash face,  Genitals (private parts) with your normal soap.             6.  Wash thoroughly, paying special attention to the area where your surgery  will be performed.  7.  Thoroughly rinse your body with warm water from the neck down.  8.  DO NOT shower/wash with your normal soap after using and rinsing off  the CHG Soap.                9.  Pat yourself dry with a clean towel.            10.  Wear clean pajamas.            11.  Place clean sheets on your bed the night of  your first shower and do not  sleep with pets. Day of Surgery : Do not apply any lotions/deodorants the morning of surgery.  Please wear clean clothes to the hospital/surgery center.  FAILURE TO FOLLOW THESE INSTRUCTIONS MAY RESULT IN THE CANCELLATION OF YOUR SURGERY     ________________________________________________________________________

## 2022-09-19 ENCOUNTER — Other Ambulatory Visit: Payer: Self-pay

## 2022-09-19 ENCOUNTER — Encounter (HOSPITAL_COMMUNITY): Payer: Self-pay

## 2022-09-19 ENCOUNTER — Encounter (HOSPITAL_COMMUNITY)
Admission: RE | Admit: 2022-09-19 | Discharge: 2022-09-19 | Disposition: A | Discharge status: Home / Self Care | Payer: Medicare Other | Source: Ambulatory Visit | Attending: Gynecologic Oncology | Admitting: Gynecologic Oncology

## 2022-09-19 ENCOUNTER — Inpatient Hospital Stay: Payer: Medicare Other | Attending: Gynecologic Oncology | Admitting: Gynecologic Oncology

## 2022-09-19 VITALS — BP 145/81 | HR 81 | Temp 97.7°F | Resp 16 | Ht 60.95 in | Wt 126.0 lb

## 2022-09-19 DIAGNOSIS — Z01812 Encounter for preprocedural laboratory examination: Secondary | ICD-10-CM | POA: Insufficient documentation

## 2022-09-19 DIAGNOSIS — N9489 Other specified conditions associated with female genital organs and menstrual cycle: Secondary | ICD-10-CM

## 2022-09-19 DIAGNOSIS — N950 Postmenopausal bleeding: Secondary | ICD-10-CM

## 2022-09-19 LAB — CBC
HCT: 39.9 % (ref 36.0–46.0)
Hemoglobin: 13 g/dL (ref 12.0–15.0)
MCH: 31.9 pg (ref 26.0–34.0)
MCHC: 32.6 g/dL (ref 30.0–36.0)
MCV: 98 fL (ref 80.0–100.0)
Platelets: 243 10*3/uL (ref 150–400)
RBC: 4.07 MIL/uL (ref 3.87–5.11)
RDW: 12.5 % (ref 11.5–15.5)
WBC: 7.3 10*3/uL (ref 4.0–10.5)
nRBC: 0 % (ref 0.0–0.2)

## 2022-09-19 LAB — COMPREHENSIVE METABOLIC PANEL
ALT: 14 U/L (ref 0–44)
AST: 18 U/L (ref 15–41)
Albumin: 4.2 g/dL (ref 3.5–5.0)
Alkaline Phosphatase: 62 U/L (ref 38–126)
Anion gap: 7 (ref 5–15)
BUN: 13 mg/dL (ref 8–23)
CO2: 26 mmol/L (ref 22–32)
Calcium: 9.5 mg/dL (ref 8.9–10.3)
Chloride: 107 mmol/L (ref 98–111)
Creatinine, Ser: 0.58 mg/dL (ref 0.44–1.00)
GFR, Estimated: >60 mL/min (ref >60)
Glucose, Bld: 93 mg/dL (ref 70–99)
Potassium: 4.5 mmol/L (ref 3.5–5.1)
Sodium: 140 mmol/L (ref 135–145)
Total Bilirubin: 0.6 mg/dL (ref 0.3–1.2)
Total Protein: 7.1 g/dL (ref 6.5–8.1)

## 2022-09-19 MED ORDER — TRAMADOL HCL 50 MG PO TABS: 50.0000 mg | ORAL_TABLET | Freq: Four times a day (QID) | ORAL | 0 refills | Status: DC | PRN

## 2022-09-19 MED ORDER — SENNOSIDES-DOCUSATE SODIUM 8.6-50 MG PO TABS: 2.0000 | ORAL_TABLET | Freq: Every day | ORAL | 1 refills | Status: AC

## 2022-09-19 NOTE — Progress Notes (Unsigned)
Patient here with her husband for a pre-operative appointment prior to her scheduled surgery on September 21, 2022. She is scheduled for dilation and curettage of the uterus to be sent to the pathologist at the beginning of surgery, robotic assisted laparoscopic bilateral salpingo-oophorectomy, possible robotic assisted total hysterectomy, possible staging if a cancer is identified. She has her pre-admission testing appointment this am at [facility].  The surgery was discussed in detail.  See after visit summary for additional details. Visual aids used to discuss items related to surgery including sequential compression stockings, foley catheter, IV pump, multi-modal pain regimen including tylenol, photo of the surgical robot, female reproductive system to discuss surgery in detail.      Discussed post-op pain management in detail including the aspects of the enhanced recovery pathway.  Advised her that a new prescription would be sent in for tramadol and it is only to be used for after her upcoming surgery.  We discussed the use of tylenol post-op and to monitor for a maximum of 4,000 mg in a 24 hour period.  Also prescribed sennakot to be used after surgery and to hold if having loose stools.  Discussed bowel regimen in detail.     Discussed the use of SCDs and measures to take at home to prevent DVT including frequent mobility.  Reportable signs and symptoms of DVT discussed. Post-operative instructions discussed and expectations for after surgery. Incisional care discussed as well including reportable signs and symptoms including erythema, drainage, wound separation.     10 minutes spent with the patient.  Verbalizing understanding of material discussed. No needs or concerns voiced at the end of the visit.   Advised patient and family to call for any needs.  Advised that her post-operative medications had been prescribed and could be picked up at any time.    This appointment is included in the global  surgical bundle as pre-operative teaching and has no charge.

## 2022-09-19 NOTE — Patient Instructions (Signed)
Preparing for your Surgery  Plan for surgery on September 21, 2022 with Dr. Jeral Pinch at [place] will be scheduled for dilation and curettage of the uterus to be sent to the pathologist at the beginning of surgery, robotic assisted laparoscopic bilateral salpingo-oophorectomy (removal of both ovaries and fallopian tubes), possible robotic assisted total hysterectomy (removal of the uterus and cervix), possible staging if a cancer is identified.   You will need to stop taking vitamin k now since this can interfere with how your blood can clot.  Pre-operative Testing - (Done)-for later today. You will receive a phone call from presurgical testing at [facility] to arrange for a pre-operative appointment and lab work.  -Bring your insurance card, copy of an advanced directive if applicable, medication list  -At that visit, you will be asked to sign a consent for a possible blood transfusion in case a transfusion becomes necessary during surgery.  The need for a blood transfusion is rare but having consent is a necessary part of your care.     -You should not be taking blood thinners or aspirin at least ten days prior to surgery unless instructed by your surgeon.  -Do not take supplements such as fish oil (omega 3), red yeast rice, turmeric before your surgery. You want to avoid medications with aspirin in them including headache powders such as BC or Goody's), Excedrin migraine.  Day Before Surgery at [place] will be asked to take in a light diet the day before surgery. You will be advised you can have clear liquids up until 3 hours before your surgery.    Eat a light diet the day before surgery.  Examples including soups, broths, toast, yogurt, mashed potatoes.  AVOID GAS PRODUCING FOODS. Things to avoid include carbonated beverages (fizzy beverages, sodas), raw fruits and raw vegetables (uncooked), or beans.   If your bowels are filled with gas, your surgeon  will have difficulty visualizing your pelvic organs which increases your surgical risks.  Your role in recovery Your role is to become active as soon as directed by your doctor, while still giving yourself time to heal.  Rest when you feel tired. You will be asked to do the following in order to speed your recovery:  - Cough and breathe deeply. This helps to clear and expand your lungs and can prevent pneumonia after surgery.  - [place]. Do mild physical activity. Walking or moving your legs help your circulation and body functions return to normal. Do not try to get up or walk alone the first time after surgery.   -If you develop swelling on one leg or the other, pain in the back of your leg, redness/warmth in one of your legs, please call the office or go to the Emergency Room to have a doppler to rule out a blood clot. For shortness of breath, chest pain-seek care in the Emergency Room as soon as possible. - Actively manage your pain. Managing your pain lets you move in comfort. We will ask you to rate your pain on a scale of zero to 10. It is your responsibility to tell your doctor or nurse where and how much you hurt so your pain can be treated.  Special Considerations -If you are diabetic, you may be placed on insulin after surgery to have closer control over your blood sugars to promote healing and recovery.  This does not mean that you will be discharged on insulin.  If applicable, your oral antidiabetics will be resumed when you are tolerating a solid diet.  -Your final pathology results from surgery should be available around one week after surgery and the results will be relayed to you when available.  -FMLA forms can be faxed to [phone] and please allow 5-7 business days for completion.  Pain Management After Surgery -You have been prescribed your pain medication and bowel regimen medications before surgery so that you can have these available when you are  discharged from the hospital. The pain medication is for use ONLY AFTER surgery and a new prescription will not be given.   -Make sure that you have Tylenol and Ibuprofen IF YOU ARE ABLE TO TAKE THESE MEDICATIONS at home to use on a regular basis after surgery for pain control. We recommend alternating the medications every hour to six hours since they work differently and are processed in the body differently for pain relief.  -Review the attached handout on narcotic use and their risks and side effects.   Bowel Regimen -You have been prescribed Sennakot-S to take nightly to prevent constipation especially if you are taking the narcotic pain medication intermittently.  It is important to prevent constipation and drink adequate amounts of liquids. You can stop taking this medication when you are not taking pain medication and you are back on your normal bowel routine.  Risks of Surgery Risks of surgery are low but include bleeding, infection, damage to surrounding structures, re-operation, blood clots, and very rarely death.   Blood Transfusion Information (For the consent to be signed before surgery)  We will be checking your blood type before surgery so in case of emergencies, we will know what type of blood you would need.                                            WHAT IS A BLOOD TRANSFUSION?  A transfusion is the replacement of blood or some of its parts. Blood is made up of multiple cells which provide different functions. Red blood cells carry oxygen and are used for blood loss replacement. White blood cells fight against infection. Platelets control bleeding. Plasma helps clot blood. Other blood products are available for specialized needs, such as hemophilia or other clotting disorders. BEFORE THE TRANSFUSION  Who gives blood for transfusions?  You may be able to donate blood to be used at a later date on yourself (autologous donation). Relatives can be asked to donate blood.  This is generally not any safer than if you have received blood from a stranger. The same precautions are taken to ensure safety when a relative's blood is donated. Healthy volunteers who are fully evaluated to make sure their blood is safe. This is blood bank blood. Transfusion therapy is the safest it has ever been in the practice of medicine. Before blood is taken from a donor, a complete history is taken to make sure that person has no history of diseases nor engages in risky social behavior (examples are intravenous drug use or sexual activity with multiple partners). The donor's travel history is screened to minimize risk of transmitting infections, such as malaria. The donated blood is tested for signs of infectious diseases, such as HIV and hepatitis. The blood is then tested to be sure it is compatible with you in order to minimize the chance of a transfusion reaction. If you  or a relative donates blood, this is often done in anticipation of surgery and is not appropriate for emergency situations. It takes many days to process the donated blood. RISKS AND COMPLICATIONS Although transfusion therapy is very safe and saves many lives, the main dangers of transfusion include:  Getting an infectious disease. Developing a transfusion reaction. This is an allergic reaction to something in the blood you were given. Every precaution is taken to prevent this. The decision to have a blood transfusion has been considered carefully by your caregiver before blood is given. Blood is not given unless the benefits outweigh the risks.  AFTER SURGERY INSTRUCTIONS  Return to work: 4-6 weeks if applicable  Activity: 1. Be up and out of the bed during the day.  Take a nap if needed.  You may walk up steps but be careful and use the hand rail.  Stair climbing will tire you more than you think, you may need to stop part way and rest.   2. No lifting or straining for 6 weeks over 10 pounds. No pushing, pulling,  straining for 6 weeks.  3. No driving for around 1 week(s).  Do not drive if you are taking narcotic pain medicine and make sure that your reaction time has returned.   4. You can shower as soon as the next day after surgery. Shower daily.  Use your regular soap and water (not directly on the incision) and pat your incision(s) dry afterwards; don't rub.  No tub baths or submerging your body in water until cleared by your surgeon. If you have the soap that was given to you by pre-surgical testing that was used before surgery, you do not need to use it afterwards because this can irritate your incisions.   5. No sexual activity and nothing in the vagina for 8-10 weeks.  6. You may experience a small amount of clear drainage from your incisions, which is normal.  If the drainage persists, increases, or changes color please call the office.  7. Do not use creams, lotions, or ointments such as neosporin on your incisions after surgery until advised by your surgeon because they can cause removal of the dermabond glue on your incisions.    8. You may experience vaginal spotting after surgery or around the 6-8 week mark from surgery when the stitches at the top of the vagina begin to dissolve.  The spotting is normal but if you experience heavy bleeding, call our office.  9. Take Tylenol or ibuprofen first for pain if you are able to take these medications and only use narcotic pain medication for severe pain not relieved by the Tylenol or Ibuprofen.  Monitor your Tylenol intake to a max of 4,000 mg in a 24 hour period. You can alternate these medications after surgery.  Diet: 1. Low sodium Heart Healthy Diet is recommended but you are cleared to resume your normal (before surgery) diet after your procedure.  2. It is safe to use a laxative, such as Miralax or Colace, if you have difficulty moving your bowels. You have been prescribed Sennakot-S to take at bedtime every evening after surgery to keep bowel  movements regular and to prevent constipation.    Wound Care: 1. Keep clean and dry.  Shower daily.  Reasons to call the Doctor: Fever - Oral temperature greater than 100.4 degrees Fahrenheit Foul-smelling vaginal discharge Difficulty urinating Nausea and vomiting Increased pain at the site of the incision that is unrelieved with pain medicine. Difficulty breathing  with or without chest pain New calf pain especially if only on one side Sudden, continuing increased vaginal bleeding with or without clots.   Contacts: For questions or concerns you should contact:  Dr. Jeral Pinch at [phone]  Joylene John, NP at [phone]  After Hours: call [phone] and have the GYN Oncologist paged/contacted (after 5 pm or on the weekends).  Messages sent via mychart are for non-urgent matters and are not responded to after hours so for urgent needs, please call the after hours number.

## 2022-09-19 NOTE — Progress Notes (Signed)
Anesthesia note:  Bowel prep reminder:NA just light diet the day before surgery  PCP - Dr. Bobbye Riggs Cardiologist -none Other-   Chest x-ray - no EKG - 09/13/22- CE Stress Test - no ECHO - no Cardiac Cath - NA  Pacemaker/ICD device last checked:NA  Sleep Study - no CPAP -   Pt is pre diabetic-no Fasting Blood Sugar -  Checks Blood Sugar _____  Blood Thinner:no Blood Thinner Instructions: Aspirin Instructions: Last Dose:  Anesthesia review: no  Patient denies shortness of breath, fever, cough and chest pain at PAT appointment Pt reports no SOB with activities  Patient verbalized understanding of instructions that were given to them at the PAT appointment. Patient was also instructed that they will need to review over the PAT instructions again at home before surgery. Yes. Significant other with her at PAT.

## 2022-09-20 ENCOUNTER — Telehealth: Payer: Self-pay

## 2022-09-20 NOTE — Telephone Encounter (Signed)
Telephone call to check on pre-operative status.  Patient compliant with pre-operative instructions. Teach back method used.  Reinforced nothing to eat after midnight. Clear liquids until 7 AM. No medications morning of surgery.  Patient to arrive at 8 AM.  No questions or concerns voiced.  Instructed to call for any needs.

## 2022-09-21 ENCOUNTER — Encounter (HOSPITAL_COMMUNITY): Payer: Self-pay | Admitting: Gynecologic Oncology

## 2022-09-21 ENCOUNTER — Ambulatory Visit (HOSPITAL_BASED_OUTPATIENT_CLINIC_OR_DEPARTMENT_OTHER): Payer: Medicare Other | Admitting: Anesthesiology

## 2022-09-21 ENCOUNTER — Other Ambulatory Visit: Payer: Self-pay

## 2022-09-21 ENCOUNTER — Encounter (HOSPITAL_COMMUNITY)
Admission: RE | Disposition: A | Discharge status: Home / Self Care | Payer: Self-pay | Source: Home / Self Care | Attending: Gynecologic Oncology

## 2022-09-21 ENCOUNTER — Ambulatory Visit (HOSPITAL_COMMUNITY): Payer: Medicare Other | Admitting: Physician Assistant

## 2022-09-21 ENCOUNTER — Ambulatory Visit (HOSPITAL_COMMUNITY)
Admission: RE | Admit: 2022-09-21 | Discharge: 2022-09-21 | Disposition: A | Discharge status: Home / Self Care | Payer: Medicare Other | Attending: Gynecologic Oncology | Admitting: Gynecologic Oncology

## 2022-09-21 DIAGNOSIS — N8003 Adenomyosis of the uterus: Secondary | ICD-10-CM | POA: Diagnosis not present

## 2022-09-21 DIAGNOSIS — D4959 Neoplasm of unspecified behavior of other genitourinary organ: Secondary | ICD-10-CM

## 2022-09-21 DIAGNOSIS — R519 Headache, unspecified: Secondary | ICD-10-CM | POA: Insufficient documentation

## 2022-09-21 DIAGNOSIS — N950 Postmenopausal bleeding: Secondary | ICD-10-CM | POA: Insufficient documentation

## 2022-09-21 DIAGNOSIS — N9489 Other specified conditions associated with female genital organs and menstrual cycle: Secondary | ICD-10-CM

## 2022-09-21 DIAGNOSIS — N838 Other noninflammatory disorders of ovary, fallopian tube and broad ligament: Secondary | ICD-10-CM | POA: Diagnosis not present

## 2022-09-21 DIAGNOSIS — I10 Essential (primary) hypertension: Secondary | ICD-10-CM | POA: Insufficient documentation

## 2022-09-21 DIAGNOSIS — D270 Benign neoplasm of right ovary: Secondary | ICD-10-CM | POA: Insufficient documentation

## 2022-09-21 DIAGNOSIS — D271 Benign neoplasm of left ovary: Secondary | ICD-10-CM | POA: Insufficient documentation

## 2022-09-21 DIAGNOSIS — N83292 Other ovarian cyst, left side: Secondary | ICD-10-CM | POA: Diagnosis not present

## 2022-09-21 DIAGNOSIS — N83291 Other ovarian cyst, right side: Secondary | ICD-10-CM

## 2022-09-21 DIAGNOSIS — K429 Umbilical hernia without obstruction or gangrene: Secondary | ICD-10-CM | POA: Diagnosis not present

## 2022-09-21 HISTORY — PX: ROBOTIC ASSISTED SALPINGO OOPHERECTOMY: SHX6082

## 2022-09-21 HISTORY — PX: UMBILICAL HERNIA REPAIR: SHX196

## 2022-09-21 HISTORY — PX: DILATION AND CURETTAGE OF UTERUS: SHX78

## 2022-09-21 LAB — TYPE AND SCREEN
ABO/RH(D): A NEG
Antibody Screen: NEGATIVE

## 2022-09-21 LAB — ABO/RH: ABO/RH(D): A NEG

## 2022-09-21 SURGERY — DILATION AND CURETTAGE
Anesthesia: General

## 2022-09-21 MED ORDER — ROCURONIUM BROMIDE 10 MG/ML (PF) SYRINGE
PREFILLED_SYRINGE | INTRAVENOUS | Status: AC
  Filled 2022-09-21: qty 10

## 2022-09-21 MED ORDER — EPHEDRINE 5 MG/ML INJ
INTRAVENOUS | Status: AC
  Filled 2022-09-21: qty 5

## 2022-09-21 MED ORDER — STERILE WATER FOR IRRIGATION IR SOLN
Status: DC | PRN
  Administered 2022-09-21: 1000 mL

## 2022-09-21 MED ORDER — ONDANSETRON HCL 4 MG/2ML IJ SOLN
INTRAMUSCULAR | Status: DC | PRN
  Administered 2022-09-21: 4 mg via INTRAVENOUS

## 2022-09-21 MED ORDER — CEFAZOLIN SODIUM-DEXTROSE 2-4 GM/100ML-% IV SOLN
INTRAVENOUS | Status: AC
  Filled 2022-09-21: qty 100

## 2022-09-21 MED ORDER — KETAMINE HCL 10 MG/ML IJ SOLN
INTRAMUSCULAR | Status: DC | PRN
  Administered 2022-09-21: 25 mg via INTRAVENOUS

## 2022-09-21 MED ORDER — HEPARIN SODIUM (PORCINE) 5000 UNIT/ML IJ SOLN
5000.0000 [IU] | INTRAMUSCULAR | Status: AC
  Administered 2022-09-21: 5000 [IU] via SUBCUTANEOUS
  Filled 2022-09-21: qty 1

## 2022-09-21 MED ORDER — OXYCODONE HCL 5 MG PO TABS: 5.0000 mg | ORAL_TABLET | ORAL | Status: DC | PRN

## 2022-09-21 MED ORDER — OXYCODONE HCL 5 MG/5ML PO SOLN: 5.0000 mg | Freq: Once | ORAL | Status: DC | PRN

## 2022-09-21 MED ORDER — LIDOCAINE 20MG/ML (2%) 15 ML SYRINGE OPTIME
INTRAMUSCULAR | Status: DC | PRN
  Administered 2022-09-21: 1.5 mg/kg/h via INTRAVENOUS

## 2022-09-21 MED ORDER — PROPOFOL 10 MG/ML IV BOLUS
INTRAVENOUS | Status: AC
  Filled 2022-09-21: qty 20

## 2022-09-21 MED ORDER — ONDANSETRON HCL 4 MG/2ML IJ SOLN: 4.0000 mg | Freq: Four times a day (QID) | INTRAMUSCULAR | Status: DC | PRN

## 2022-09-21 MED ORDER — LIDOCAINE HCL (PF) 2 % IJ SOLN
INTRAMUSCULAR | Status: AC
  Filled 2022-09-21: qty 5

## 2022-09-21 MED ORDER — PHENYLEPHRINE HCL (PRESSORS) 10 MG/ML IV SOLN
INTRAVENOUS | Status: AC
  Filled 2022-09-21: qty 1

## 2022-09-21 MED ORDER — DEXAMETHASONE SODIUM PHOSPHATE 4 MG/ML IJ SOLN
4.0000 mg | INTRAMUSCULAR | Status: AC
  Administered 2022-09-21: 4 mg via INTRAVENOUS

## 2022-09-21 MED ORDER — BUPIVACAINE HCL 0.25 % IJ SOLN
INTRAMUSCULAR | Status: AC
  Filled 2022-09-21: qty 1

## 2022-09-21 MED ORDER — FENTANYL CITRATE (PF) 250 MCG/5ML IJ SOLN
INTRAMUSCULAR | Status: AC
  Filled 2022-09-21: qty 5

## 2022-09-21 MED ORDER — LIDOCAINE 2% (20 MG/ML) 5 ML SYRINGE
INTRAMUSCULAR | Status: DC | PRN
  Administered 2022-09-21: 40 mg via INTRAVENOUS

## 2022-09-21 MED ORDER — LIDOCAINE HCL 2 % IJ SOLN
INTRAMUSCULAR | Status: AC
  Filled 2022-09-21: qty 20

## 2022-09-21 MED ORDER — BUPIVACAINE HCL 0.25 % IJ SOLN
INTRAMUSCULAR | Status: DC | PRN
  Administered 2022-09-21: 50 mL

## 2022-09-21 MED ORDER — PROPOFOL 10 MG/ML IV BOLUS
INTRAVENOUS | Status: DC | PRN
  Administered 2022-09-21: 120 mg via INTRAVENOUS

## 2022-09-21 MED ORDER — ONDANSETRON HCL 4 MG/2ML IJ SOLN
INTRAMUSCULAR | Status: AC
  Filled 2022-09-21: qty 2

## 2022-09-21 MED ORDER — HYDROMORPHONE HCL 1 MG/ML IJ SOLN
INTRAMUSCULAR | Status: AC
  Filled 2022-09-21: qty 1

## 2022-09-21 MED ORDER — LACTATED RINGERS IR SOLN
Status: DC | PRN
  Administered 2022-09-21: 1000 mL

## 2022-09-21 MED ORDER — EPHEDRINE SULFATE-NACL 50-0.9 MG/10ML-% IV SOSY
PREFILLED_SYRINGE | INTRAVENOUS | Status: DC | PRN
  Administered 2022-09-21 (×3): 5 mg via INTRAVENOUS

## 2022-09-21 MED ORDER — ACETAMINOPHEN 500 MG PO TABS
1000.0000 mg | ORAL_TABLET | ORAL | Status: AC
  Administered 2022-09-21: 1000 mg via ORAL
  Filled 2022-09-21: qty 2

## 2022-09-21 MED ORDER — LACTATED RINGERS IV SOLN
INTRAVENOUS | Status: DC
Start: 2022-09-21 — End: 2022-09-21

## 2022-09-21 MED ORDER — ROCURONIUM BROMIDE 10 MG/ML (PF) SYRINGE
PREFILLED_SYRINGE | INTRAVENOUS | Status: DC | PRN
  Administered 2022-09-21: 20 mg via INTRAVENOUS
  Administered 2022-09-21: 60 mg via INTRAVENOUS

## 2022-09-21 MED ORDER — TRAMADOL HCL 50 MG PO TABS: 50.0000 mg | ORAL_TABLET | Freq: Four times a day (QID) | ORAL | Status: DC | PRN

## 2022-09-21 MED ORDER — DEXAMETHASONE SODIUM PHOSPHATE 10 MG/ML IJ SOLN
INTRAMUSCULAR | Status: AC
  Filled 2022-09-21: qty 1

## 2022-09-21 MED ORDER — LACTATED RINGERS IV SOLN: INTRAVENOUS | Status: DC | PRN

## 2022-09-21 MED ORDER — CEFAZOLIN SODIUM-DEXTROSE 2-4 GM/100ML-% IV SOLN
2.0000 g | Freq: Once | INTRAVENOUS | Status: AC
  Administered 2022-09-21: 2 g via INTRAVENOUS

## 2022-09-21 MED ORDER — PHENYLEPHRINE 80 MCG/ML (10ML) SYRINGE FOR IV PUSH (FOR BLOOD PRESSURE SUPPORT)
PREFILLED_SYRINGE | INTRAVENOUS | Status: AC
  Filled 2022-09-21: qty 10

## 2022-09-21 MED ORDER — FENTANYL CITRATE (PF) 250 MCG/5ML IJ SOLN
INTRAMUSCULAR | Status: DC | PRN
  Administered 2022-09-21: 100 ug via INTRAVENOUS

## 2022-09-21 MED ORDER — SUGAMMADEX SODIUM 200 MG/2ML IV SOLN
INTRAVENOUS | Status: DC | PRN
  Administered 2022-09-21: 150 mg via INTRAVENOUS

## 2022-09-21 MED ORDER — OXYCODONE HCL 5 MG PO TABS: 5.0000 mg | ORAL_TABLET | Freq: Once | ORAL | Status: DC | PRN

## 2022-09-21 MED ORDER — ORAL CARE MOUTH RINSE: 15.0000 mL | Freq: Once | OROMUCOSAL | Status: AC

## 2022-09-21 MED ORDER — HYDROMORPHONE HCL 1 MG/ML IJ SOLN
0.2500 mg | INTRAMUSCULAR | Status: DC | PRN
  Administered 2022-09-21: 0.5 mg via INTRAVENOUS

## 2022-09-21 MED ORDER — KETAMINE HCL 50 MG/5ML IJ SOSY
PREFILLED_SYRINGE | INTRAMUSCULAR | Status: AC
  Filled 2022-09-21: qty 5

## 2022-09-21 MED ORDER — ACETAMINOPHEN 500 MG PO TABS: 1000.0000 mg | ORAL_TABLET | Freq: Once | ORAL | Status: DC

## 2022-09-21 MED ORDER — AMISULPRIDE (ANTIEMETIC) 5 MG/2ML IV SOLN: 10.0000 mg | Freq: Once | INTRAVENOUS | Status: DC | PRN

## 2022-09-21 MED ORDER — CHLORHEXIDINE GLUCONATE 0.12 % MT SOLN
15.0000 mL | Freq: Once | OROMUCOSAL | Status: AC
  Administered 2022-09-21: 15 mL via OROMUCOSAL

## 2022-09-21 MED ORDER — PHENYLEPHRINE HCL-NACL 20-0.9 MG/250ML-% IV SOLN
INTRAVENOUS | Status: DC | PRN
  Administered 2022-09-21: 20 ug/min via INTRAVENOUS

## 2022-09-21 MED ORDER — ONDANSETRON HCL 4 MG PO TABS: 4.0000 mg | ORAL_TABLET | Freq: Four times a day (QID) | ORAL | Status: DC | PRN

## 2022-09-21 MED ORDER — ONDANSETRON HCL 4 MG/2ML IJ SOLN: 4.0000 mg | Freq: Once | INTRAMUSCULAR | Status: DC | PRN

## 2022-09-21 MED ORDER — PHENYLEPHRINE 80 MCG/ML (10ML) SYRINGE FOR IV PUSH (FOR BLOOD PRESSURE SUPPORT)
PREFILLED_SYRINGE | INTRAVENOUS | Status: DC | PRN
  Administered 2022-09-21 (×2): 80 ug via INTRAVENOUS
  Administered 2022-09-21: 120 ug via INTRAVENOUS
  Administered 2022-09-21: 80 ug via INTRAVENOUS

## 2022-09-21 SURGICAL SUPPLY — 71 items
APPLICATOR SURGIFLO ENDO (HEMOSTASIS) IMPLANT
BAG COUNTER SPONGE SURGICOUNT (BAG) IMPLANT
BAG LAPAROSCOPIC 12 15 PORT 16 (BASKET) IMPLANT
BAG RETRIEVAL 12/15 (BASKET) ×2
BLADE SURG SZ10 CARB STEEL (BLADE) IMPLANT
COVER BACK TABLE 60X90IN (DRAPES) ×2 IMPLANT
COVER TIP SHEARS 8 DVNC (MISCELLANEOUS) ×2 IMPLANT
COVER TIP SHEARS 8MM DA VINCI (MISCELLANEOUS) ×2
DERMABOND ADVANCED .7 DNX12 (GAUZE/BANDAGES/DRESSINGS) ×2 IMPLANT
DRAPE ARM DVNC X/XI (DISPOSABLE) ×8 IMPLANT
DRAPE COLUMN DVNC XI (DISPOSABLE) ×2 IMPLANT
DRAPE DA VINCI XI ARM (DISPOSABLE) ×8
DRAPE DA VINCI XI COLUMN (DISPOSABLE) ×2
DRAPE SURG IRRIG POUCH 19X23 (DRAPES) ×2 IMPLANT
DRSG OPSITE POSTOP 4X6 (GAUZE/BANDAGES/DRESSINGS) IMPLANT
DRSG OPSITE POSTOP 4X8 (GAUZE/BANDAGES/DRESSINGS) IMPLANT
DRSG TELFA 3X8 NADH STRL (GAUZE/BANDAGES/DRESSINGS) ×2 IMPLANT
ELECT PENCIL ROCKER SW 15FT (MISCELLANEOUS) IMPLANT
ELECT REM PT RETURN 15FT ADLT (MISCELLANEOUS) ×2 IMPLANT
GAUZE 4X4 16PLY ~~LOC~~+RFID DBL (SPONGE) ×4 IMPLANT
GLOVE BIO SURGEON STRL SZ 6 (GLOVE) ×8 IMPLANT
GLOVE BIO SURGEON STRL SZ 6.5 (GLOVE) ×2 IMPLANT
GOWN STRL REUS W/ TWL LRG LVL3 (GOWN DISPOSABLE) ×8 IMPLANT
GOWN STRL REUS W/TWL LRG LVL3 (GOWN DISPOSABLE) ×12
GRASPER SUT TROCAR 14GX15 (MISCELLANEOUS) IMPLANT
HOLDER FOLEY CATH W/STRAP (MISCELLANEOUS) IMPLANT
IRRIG SUCT STRYKERFLOW 2 WTIP (MISCELLANEOUS) ×2
IRRIGATION SUCT STRKRFLW 2 WTP (MISCELLANEOUS) ×2 IMPLANT
KIT TURNOVER KIT A (KITS) IMPLANT
LIGASURE IMPACT 36 18CM CVD LR (INSTRUMENTS) IMPLANT
MANIPULATOR ADVINCU DEL 3.0 PL (MISCELLANEOUS) IMPLANT
MANIPULATOR ADVINCU DEL 3.5 PL (MISCELLANEOUS) IMPLANT
MANIPULATOR UTERINE 4.5 ZUMI (MISCELLANEOUS) IMPLANT
NDL HYPO 21X1.5 SAFETY (NEEDLE) ×2 IMPLANT
NEEDLE HYPO 21X1.5 SAFETY (NEEDLE) ×2 IMPLANT
OBTURATOR OPTICAL STANDARD 8MM (TROCAR) ×2
OBTURATOR OPTICAL STND 8 DVNC (TROCAR) ×2
OBTURATOR OPTICALSTD 8 DVNC (TROCAR) ×2 IMPLANT
PACK ROBOT GYN CUSTOM WL (TRAY / TRAY PROCEDURE) ×2 IMPLANT
PAD OB MATERNITY 4.3X12.25 (PERSONAL CARE ITEMS) ×2 IMPLANT
PAD POSITIONING PINK XL (MISCELLANEOUS) ×2 IMPLANT
PORT ACCESS TROCAR AIRSEAL 12 (TROCAR) ×2 IMPLANT
PORT ACCESS TROCAR AIRSEAL 5M (TROCAR) ×2
SEAL CANN UNIV 5-8 DVNC XI (MISCELLANEOUS) ×8 IMPLANT
SEAL XI 5MM-8MM UNIVERSAL (MISCELLANEOUS) ×8
SET TRI-LUMEN FLTR TB AIRSEAL (TUBING) ×2 IMPLANT
SOL PREP POV-IOD 4OZ 10% (MISCELLANEOUS) ×2 IMPLANT
SPIKE FLUID TRANSFER (MISCELLANEOUS) ×2 IMPLANT
SPONGE T-LAP 18X18 ~~LOC~~+RFID (SPONGE) IMPLANT
SURGIFLO W/THROMBIN 8M KIT (HEMOSTASIS) IMPLANT
SUT MNCRL AB 4-0 PS2 18 (SUTURE) IMPLANT
SUT PDS AB 1 TP1 96 (SUTURE) IMPLANT
SUT VIC AB 0 CT1 27 (SUTURE)
SUT VIC AB 0 CT1 27XBRD ANTBC (SUTURE) IMPLANT
SUT VIC AB 2-0 CT1 27 (SUTURE)
SUT VIC AB 2-0 CT1 TAPERPNT 27 (SUTURE) IMPLANT
SUT VIC AB 2-0 SH 27 (SUTURE) ×2
SUT VIC AB 2-0 SH 27X BRD (SUTURE) IMPLANT
SUT VIC AB 4-0 PS2 18 (SUTURE) ×4 IMPLANT
SUT VLOC 180 0 9IN  GS21 (SUTURE)
SUT VLOC 180 0 9IN GS21 (SUTURE) IMPLANT
SYR 10ML LL (SYRINGE) IMPLANT
SYS BAG RETRIEVAL 10MM (BASKET) ×2
SYS WOUND ALEXIS 18CM MED (MISCELLANEOUS)
SYSTEM BAG RETRIEVAL 10MM (BASKET) IMPLANT
SYSTEM WOUND ALEXIS 18CM MED (MISCELLANEOUS) IMPLANT
TRAP SPECIMEN MUCUS 40CC (MISCELLANEOUS) IMPLANT
TRAY FOLEY MTR SLVR 16FR STAT (SET/KITS/TRAYS/PACK) ×2 IMPLANT
UNDERPAD 30X36 HEAVY ABSORB (UNDERPADS AND DIAPERS) ×4 IMPLANT
WATER STERILE IRR 1000ML POUR (IV SOLUTION) ×2 IMPLANT
YANKAUER SUCT BULB TIP 10FT TU (MISCELLANEOUS) IMPLANT

## 2022-09-21 NOTE — Transfer of Care (Signed)
Immediate Anesthesia Transfer of Care Note  Patient: Lori Cohen  Procedure(s) Performed: DILATATION AND CURETTAGE XI ROBOTIC ASSISTED SALPINGO OOPHORECTOMY, TOTAL HYSTERECTOMY (Bilateral) HERNIA REPAIR UMBILICAL ADULT  Patient Location: PACU  Anesthesia Type:General  Level of Consciousness: awake, drowsy and responds to stimulation  Airway & Oxygen Therapy: Patient Spontanous Breathing and Patient connected to face mask oxygen  Post-op Assessment: Report given to RN and Post -op Vital signs reviewed and stable  Post vital signs: Reviewed and stable  Last Vitals:  Vitals Value Taken Time  BP 119/71 09/21/22 1146  Temp    Pulse 78 09/21/22 1148  Resp 22 09/21/22 1148  SpO2 100 % 09/21/22 1148  Vitals shown include unvalidated device data.  Last Pain:  Vitals:   09/21/22 0811  TempSrc:   PainSc: 0-No pain         Complications: No notable events documented.

## 2022-09-21 NOTE — Anesthesia Postprocedure Evaluation (Signed)
Anesthesia Post Note  Patient: Lori Cohen  Procedure(s) Performed: DILATATION AND CURETTAGE XI ROBOTIC ASSISTED SALPINGO OOPHORECTOMY, TOTAL HYSTERECTOMY (Bilateral) HERNIA REPAIR UMBILICAL ADULT     Patient location during evaluation: PACU Anesthesia Type: General Level of consciousness: awake and alert, oriented and patient cooperative Pain management: pain level controlled Vital Signs Assessment: post-procedure vital signs reviewed and stable Respiratory status: spontaneous breathing, nonlabored ventilation and respiratory function stable Cardiovascular status: blood pressure returned to baseline and stable Postop Assessment: no apparent nausea or vomiting Anesthetic complications: no   No notable events documented.  Last Vitals:  Vitals:   09/21/22 1315 09/21/22 1330  BP: 110/65 116/67  Pulse: 76 69  Resp: 15 17  Temp:    SpO2: 95% 100%    Last Pain:  Vitals:   09/21/22 1330  TempSrc:   PainSc: [place]

## 2022-09-21 NOTE — Discharge Instructions (Addendum)
AFTER SURGERY INSTRUCTIONS   Return to work: 4-6 weeks if applicable  Today Dr. Berline Lopes took a sample from the lining of the uterus at the start of surgery. She then proceeded with removal of the ovaries and fallopian tubes. The pathologist felt they saw atypical (abnormal) cells in the sample from the lining of the uterus so this prompted Dr. Berline Lopes to perform a hysterectomy (removal of the uterus and cervix). Once the hysterectomy was complete, the uterus was sent for frozen section and the pathologist did not see any thing abnormal but we will wait for the final path to return. There was a large cyst on the right ovary and small cyst on the left. On frozen section, the pathologist did not feel there was a cancer. Final pathology will confirm this.  A small hernia was also noted at your umbilicus (belly button). Dr. Berline Lopes repaired this.   Activity: 1. Be up and out of the bed during the day.  Take a nap if needed.  You may walk up steps but be careful and use the hand rail.  Stair climbing will tire you more than you think, you may need to stop part way and rest.    2. No lifting or straining for 6 weeks over 10 pounds. No pushing, pulling, straining for 6 weeks.   3. No driving for around 1 week(s).  Do not drive if you are taking narcotic pain medicine and make sure that your reaction time has returned.    4. You can shower as soon as the next day after surgery. Shower daily.  Use your regular soap and water (not directly on the incision) and pat your incision(s) dry afterwards; don't rub.  No tub baths or submerging your body in water until cleared by your surgeon. If you have the soap that was given to you by pre-surgical testing that was used before surgery, you do not need to use it afterwards because this can irritate your incisions.    5. No sexual activity and nothing in the vagina for 8-10 weeks since you had a hysterectomy. There is an incision at the top of the vagina with dissolvable  stitches. Do not put anything in the vagina because your bowels (intestines) are on the other side of the incision and the incision needs to stay closed.   6. You may experience a small amount of clear drainage from your incisions, which is normal.  If the drainage persists, increases, or changes color please call the office.   7. Do not use creams, lotions, or ointments such as neosporin on your incisions after surgery until advised by your surgeon because they can cause removal of the dermabond glue on your incisions.     8. You may experience vaginal spotting after surgery or around the 6-8 week mark from surgery when the stitches at the top of the vagina begin to dissolve (since you had a hysterectomy).  The spotting is normal but if you experience heavy bleeding, call our office.   9. Take Tylenol or ibuprofen first for pain if you are able to take these medications and only use narcotic pain medication for severe pain not relieved by the Tylenol or Ibuprofen.  Monitor your Tylenol intake to a max of 4,000 mg in a 24 hour period. You can alternate these medications after surgery.   Diet: 1. Low sodium Heart Healthy Diet is recommended but you are cleared to resume your normal (before surgery) diet after your procedure.  2. It is safe to use a laxative, such as Miralax or Colace, if you have difficulty moving your bowels. You have been prescribed Sennakot-S to take at bedtime every evening after surgery to keep bowel movements regular and to prevent constipation.     Wound Care: 1. Keep clean and dry.  Shower daily.   Reasons to call the Doctor: Fever - Oral temperature greater than 100.4 degrees Fahrenheit Foul-smelling vaginal discharge Difficulty urinating Nausea and vomiting Increased pain at the site of the incision that is unrelieved with pain medicine. Difficulty breathing with or without chest pain New calf pain especially if only on one side Sudden, continuing increased  vaginal bleeding with or without clots.   Contacts: For questions or concerns you should contact:   Dr. Jeral Pinch at [phone]   Joylene John, NP at [phone]   After Hours: call [phone] and have the GYN Oncologist paged/contacted (after 5 pm or on the weekends).   Messages sent via mychart are for non-urgent matters and are not responded to after hours so for urgent needs, please call the after hours number.

## 2022-09-21 NOTE — Op Note (Signed)
OPERATIVE NOTE  Pre-operative Diagnosis: Bilateral adnexal masses, normal tumor markers, episode of PMB earlier this year  Post-operative Diagnosis: same, benign bilateral simple ovarian cysts, benign endometrium  Operation: Dilation and curettage, robotic-assisted laparoscopic total hysterectomy with bilateral salpingoophorectomy, oversew of bladder peritoneum, primary umbilical hernia repair.  Surgeon: Jeral Pinch MD  Assistant Surgeon: Joylene John NP  Anesthesia: GET  Urine Output: 100 cc  Operative Findings:  On EUA, large mobile mass filling the cul de sac. Small mobile uterus. On intra-abdominal entry, normal upper abdominal survey other than a 4 cm hemangioma eminating from left aspect of the liver (see pictures in media). Normal omentum, small and large bowel. Right ovary with 12 cm simple appearing cystic mass, clear fluid on contained cyst drainage. Left ovary with a 2-3 cm simple appearing cyst, clear fluid on contained cyst drainage. Elongated but normal appearing fallopian tubes. Uterus 4-6 cm and normal in appearance. No ascites. No intra-abdominal or pelvic evidence of disease Frozen section of scant endometrium showed some atypical cells, so hysterectomy was performed for diagnostic purposes. Bilateral ovaries with simple appearing cysts grossly, nothing to freeze.   Estimated Blood Loss:  75 cc      Total IV Fluids: see I&O flowsheet         Specimens: uterus, cervix, bilateral tubes and ovaries, pelvic washings         Complications:  None apparent; patient tolerated the procedure well.         Disposition: PACU - hemodynamically stable.  Procedure Details  The patient was seen in the Holding Room. The risks, benefits, complications, treatment options, and expected outcomes were discussed with the patient.  The patient concurred with the proposed plan, giving informed consent.  The site of surgery properly noted/marked. The patient was identified as Lori Cohen and the procedure verified as a Robotic-assisted hysterectomy with bilateral salpingo oophorectomy.   After induction of anesthesia, the patient was draped and prepped in the usual sterile manner. Patient was placed in supine position after anesthesia and draped and prepped in the usual sterile manner as follows: Her arms were tucked to her side with all appropriate precautions.  The patient was secured to the bed with padding and tape across her chest.  The patient was placed in the semi-lithotomy position in [place].  The perineum and vagina were prepped with Betadine. The abdomen was prepped with CholoraPrep and the patient draped after the prep had been allowed to dry for 3 minutes.  A Time Out was held and the above information confirmed.  The urethra was prepped with Betadine. Foley catheter was placed.  A sterile speculum was placed in the vagina.  The cervix was grasped with a single-tooth tenaculum. The cervix was dilated with Kennon Rounds dilators.  A curettage was performed with a small curette and sent to pathology for frozen section. The ZUMI uterine manipulator with a small colpotomizer ring was placed without difficulty.  A pneum occluder balloon was placed over the manipulator.  OG tube placement was confirmed and to suction.   Next, a 10 mm skin incision was made 1 cm below the subcostal margin in the midclavicular line.  The 5 mm Optiview port and scope was used for direct entry.  Opening pressure was under 10 mm CO2.  The abdomen was insufflated and the findings were noted as above.   At this point and all points during the procedure, the patient's intra-abdominal pressure did not exceed 15 mmHg. Next, an 8 mm skin incision  was made within the umbilicus given hernia present and a right and left port were placed about 8 cm lateral to the robot port on the right and left side.  A fourth arm was placed on the right.  The 5 mm assist trocar was exchanged for a 10-12 mm port. All ports  were placed under direct visualization.  The patient was placed in steep Trendelenburg.  Bowel was noted to already be in the upper abdomen.  The robot was docked in the normal manner.  The right and left peritoneum were opened parallel to the IP ligament to open the retroperitoneal spaces bilaterally. The ureter was noted to be on the medial leaf of the broad ligament.  The peritoneum above the ureter was incised and stretched and the infundibulopelvic ligament was skeletonized, cauterized and cut.  The fallopian tubes and utero-ovarian ligaments were skeletonized, cauterized and transected just lateral to the uterus, freeing both adnexa. The larger adnexa was placed in a 15 mm Endocatch bag and the small in a 10 mm Endocatch bag. After cyst decompression in a contained manner, bilateral adnexa were delivered through the assist trocar and sent for frozen section. The assist trocar was replaced under direct visualization. Given frozen section of the endometrium, decision made to proceed with total hysterectomy.  The round ligaments were transected.  The posterior peritoneum was taken down to the level of the KOH ring.  The anterior peritoneum was also taken down.  The bladder flap was created to the level of the KOH ring.  The uterine artery on the right side was skeletonized, cauterized and cut in the normal manner.  A similar procedure was performed on the left.  The colpotomy was made and the uterus, cervix, bilateral ovaries and tubes were amputated and delivered through the vagina.  Pedicles were inspected and excellent hemostasis was achieved.    The colpotomy at the vaginal cuff was closed with Vicryl on a CT1 needle in a running manner.  Given some bleeding from a peritoneal tear along the anterior cul de sac, a 2-0 Vicryl was used to oversew this peritoneum in running fashion and achieve hemostasis. Irrigation was used and excellent hemostasis was achieved.  At this point in the procedure was  completed.  Robotic instruments were removed under direct visulaization.  The robot was undocked. The fascia at the 10-12 mm port was closed with 0 Vicryl on a UR-5 needle.  The fascia at the umbilical port was identified and grasped. The skin incision of this incision was extended some. The fascia of the umbilical hernia was closed with running 0 Vicryl. The subcuticular tissue of all incisions was closed with 4-0 Vicryl and the skin was closed with 4-0 Monocryl in a subcuticular manner.  Dermabond was applied.    The vagina was swabbed with minimal bleeding noted. Foley catheter was removed.  All sponge, lap and needle counts were correct x  3.   The patient was transferred to the recovery room in stable condition.  Jeral Pinch, MD

## 2022-09-21 NOTE — Anesthesia Procedure Notes (Signed)
Procedure Name: Intubation Date/Time: 09/21/2022 9:33 AM  Performed by: Lollie Sails, CRNAPre-anesthesia Checklist: Patient identified, Emergency Drugs available, Suction available, Patient being monitored and Timeout performed Patient Re-evaluated:Patient Re-evaluated prior to induction Oxygen Delivery Method: Circle system utilized Preoxygenation: Pre-oxygenation with 100% oxygen Induction Type: IV induction Ventilation: Mask ventilation without difficulty Laryngoscope Size: Miller and 2 Grade View: Grade I Tube type: Oral Tube size: 7.0 mm Number of attempts: 1 Airway Equipment and Method: Stylet Placement Confirmation: ETT inserted through vocal cords under direct vision, positive ETCO2 and breath sounds checked- equal and bilateral Secured at: 22 cm Tube secured with: Tape Dental Injury: Teeth and Oropharynx as per pre-operative assessment

## 2022-09-21 NOTE — H&P (Signed)
Gynecologic Oncology H&P  09/21/22  Treatment History: Patient endorses having postmenopausal bleeding back in 2015.  She had an ultrasound at that point for work-up of her postmenopausal bleeding.  Endometrium was 1.3 mm but she was was noted to have incidental bilateral simple appearing adnexal cyst.   Pelvic ultrasound at [place] on 06/04/2014: Simple cyst noted bilaterally, left measures up to 2 cm and right up to 7.1 cm. Pelvic ultrasound exam at [facility] [facility] on 01/03/2015 was bilateral simple cysts, left measuring up to 1.6 cm and right up to 6.8 cm.   CA-125 in 03/2020: 9.3.   CA-125 on 06/27/2022 was 8.1.   Pelvic ultrasound on 7/18: Uterus measures 4.8 x 1.4 x 3.8 cm.  Endometrial lining is 2 mm.  Right ovary measures 10 x 7 x 9 cm with an anechoic structure.  No internal septations or mural nodules.  Left ovary measures up to 3.8 cm.  There is a 3 x 2 x 4 cm cyst with thin internal septation.  No free fluid.   Patient had 2 days of very light vaginal spotting in May this year.  This was her second episode of postmenopausal bleeding.  She denies any further bleeding since.  She denies any pelvic or abdominal pain.  She reports regular bowel and bladder function.   Interval History: Doing well. No new issues since last visit with me.  Past Medical/Surgical History: Past Medical History:  Diagnosis Date   Heart murmur 09/13/2022   not detected by PCP   High cholesterol    History of Hypercalcemia    resolved with surgery   history of Hyperparathyroidism ([place])    s/p surgery   Hypertension    Low vitamin D level    Migraines    history of migraines prior to menopause, now rare   Osteopenia     Past Surgical History:  Procedure Laterality Date   PARATHYROIDECTOMY     last 5 yesrs    Family History  Problem Relation Age of Onset   Breast cancer Mother        Rare form of breast cancer- didnt require any chemo/rad- per pt   Ovarian cysts Mother         removed, no further f/u   Colon cancer Neg Hx    Endometrial cancer Neg Hx    Ovarian cancer Neg Hx    Pancreatic cancer Neg Hx    Prostate cancer Neg Hx     Social History   Socioeconomic History   Marital status: Single    Spouse name: Not on file   Number of children: Not on file   Years of education: Not on file   Highest education level: Not on file  Occupational History   Not on file  Tobacco Use   Smoking status: Never   Smokeless tobacco: Never  Vaping Use   Vaping Use: Never used  Substance and Sexual Activity   Alcohol use: No   Drug use: No   Sexual activity: Yes    Birth control/protection: Post-menopausal  Other Topics Concern   Not on file  Social History Narrative   Not on file   Social Determinants of Health   Financial Resource Strain: Not on file  Food Insecurity: Not on file  Transportation Needs: Not on file  Physical Activity: Not on file  Stress: Not on file  Social Connections: Not on file    Current Medications:  Current Facility-Administered Medications:    dexamethasone (  DECADRON) injection 4 mg, 4 mg, Intravenous, On Call to OR, Cross, Carollee Massed, NP   lactated ringers infusion, , Intravenous, Continuous, Lynda Rainwater, MD, Last Rate: 10 mL/hr at 09/21/22 0822, New Bag at 09/21/22 9179  Review of Systems: + bruising easily Denies appetite changes, fevers, chills, fatigue, unexplained weight changes. Denies hearing loss, neck lumps or masses, mouth sores, ringing in ears or voice changes. Denies cough or wheezing.  Denies shortness of breath. Denies chest pain or palpitations. Denies leg swelling. Denies abdominal distention, pain, blood in stools, constipation, diarrhea, nausea, vomiting, or early satiety. Denies pain with intercourse, dysuria, frequency, hematuria or incontinence. Denies hot flashes, pelvic pain, vaginal bleeding or vaginal discharge.   Denies joint pain, back pain or muscle pain/cramps. Denies itching, rash,  or wounds. Denies dizziness, headaches, numbness or seizures. Denies swollen lymph nodes or glands, denies easy bleeding. Denies anxiety, depression, confusion, or decreased concentration.  Physical Exam: BP 137/80   Pulse 86   Temp 97.9 F (36.6 C) (Oral)   Resp 18   Ht 5' (1.524 m)   Wt 127 lb 13.9 oz (58 kg)   SpO2 100%   BMI 24.97 kg/m  General: Alert, oriented, no acute distress.  HEENT: Normocephalic, atraumatic. Sclera anicteric.  Chest: Clear to auscultation bilaterally. No wheezes, rhonchi, or rales. Cardiovascular: Regular rate and rhythm, no murmurs, rubs, or gallops.  Abdomen: Normoactive bowel sounds. Soft, nondistended, nontender to palpation. No masses or hepatosplenomegaly appreciated. No palpable fluid wave.  Extremities: Grossly normal range of motion. Warm, well perfused. No edema bilaterally.  Skin: No rashes or lesions  Laboratory & Radiologic Studies: CBC    Component Value Date/Time   WBC 7.3 09/19/2022 1100   RBC 4.07 09/19/2022 1100   HGB 13.0 09/19/2022 1100   HCT 39.9 09/19/2022 1100   PLT 243 09/19/2022 1100   MCV 98.0 09/19/2022 1100   MCH 31.9 09/19/2022 1100   MCHC 32.6 09/19/2022 1100   RDW 12.5 09/19/2022 1100   LYMPHSABS 0.8 02/13/2017 2200   MONOABS 0.3 02/13/2017 2200   EOSABS 0.0 02/13/2017 2200   BASOSABS 0.0 02/13/2017 2200      Latest Ref Rng & Units 09/19/2022   11:00 AM 02/13/2017   10:00 PM 10/12/2015    4:42 PM  BMP  Glucose 70 - 99 mg/dL 93  195  113   BUN 8 - 23 mg/dL '13  12  8   '$ Creatinine 0.44 - 1.00 mg/dL 0.58  0.73  0.83   Sodium 135 - 145 mmol/L 140  134  140   Potassium 3.5 - 5.1 mmol/L 4.5  3.5  4.5   Chloride 98 - 111 mmol/L 107  102  103   CO2 22 - 32 mmol/L '26  22  29   '$ Calcium 8.9 - 10.3 mg/dL 9.5  8.8  12.1    Assessment & Plan: JARAE NEMMERS is a 71 y.o. postmenopausal patient with very slow growth of bilateral mostly cystic adnexal masses, recent episode of postmenopausal bleeding.  Plan for  definitive surgery today with BSO. Will also sample endometrium with frozen section to help guide need for concurrent TRH. Ovarian pathology to dictate need for any staging today.   Plan for Robotic BSO, endometrial sampling, possible staging including TRH. The risks of surgery were discussed in detail and she understands these to include infection; wound separation; hernia; vaginal cuff separation, injury to adjacent organs such as bowel, bladder, blood vessels, ureters and nerves; bleeding which may require  blood transfusion; anesthesia risk; thromboembolic events; possible death; unforeseen complications; possible need for re-exploration; medical complications such as heart attack, stroke, pleural effusion and pneumonia; and, if full lymphadenectomy is performed the risk of lymphedema and lymphocyst. The patient will receive DVT and antibiotic prophylaxis as indicated. She voiced a clear understanding. She had the opportunity to ask questions.   Jeral Pinch, MD  Division of Gynecologic Oncology  Department of Obstetrics and Gynecology  [facility]

## 2022-09-21 NOTE — Anesthesia Preprocedure Evaluation (Addendum)
Anesthesia Evaluation  Patient identified by MRN, date of birth, ID band Patient awake    Reviewed: Allergy & Precautions, NPO status , Patient's Chart, lab work & pertinent test results  Airway Mallampati: II  TM Distance: >3 FB Neck ROM: Full    Dental no notable dental hx. (+) Teeth Intact, Dental Advisory Given   Pulmonary neg pulmonary ROS,    Pulmonary exam normal breath sounds clear to auscultation       Cardiovascular hypertension, Pt. on medications Normal cardiovascular exam Rhythm:Regular Rate:Normal     Neuro/Psych  Headaches, negative psych ROS   GI/Hepatic negative GI ROS, Neg liver ROS,   Endo/Other  negative endocrine ROS  Renal/GU negative Renal ROS  negative genitourinary   Musculoskeletal negative musculoskeletal ROS (+)   Abdominal   Peds  Hematology negative hematology ROS (+) Hb 13   Anesthesia Other Findings   Reproductive/Obstetrics negative OB ROS                            Anesthesia Physical Anesthesia Plan  ASA: 2  Anesthesia Plan: General   Post-op Pain Management: Tylenol PO (pre-op)*, Ketamine IV* and Lidocaine infusion*   Induction: Intravenous  PONV Risk Score and Plan: 4 or greater and Ondansetron, Dexamethasone, Midazolam and Treatment may vary due to age or medical condition  Airway Management Planned: Oral ETT  Additional Equipment: None  Intra-op Plan:   Post-operative Plan: Extubation in OR  Informed Consent: I have reviewed the patients History and Physical, chart, labs and discussed the procedure including the risks, benefits and alternatives for the proposed anesthesia with the patient or authorized representative who has indicated his/her understanding and acceptance.     Dental advisory given  Plan Discussed with: CRNA  Anesthesia Plan Comments:        Anesthesia Quick Evaluation

## 2022-09-22 ENCOUNTER — Encounter (HOSPITAL_COMMUNITY): Payer: Self-pay | Admitting: Gynecologic Oncology

## 2022-09-22 ENCOUNTER — Telehealth: Payer: Self-pay

## 2022-09-22 LAB — CYTOLOGY - NON PAP

## 2022-09-22 NOTE — Telephone Encounter (Signed)
Spoke with Lori Cohen this morning. She states she is eating, drinking and urinating well. She has not had a BM yet but is passing gas. She is taking senokot as prescribed and encouraged her to drink plenty of water. She denies fever or chills. Incisions are dry and intact. She rates her pain 3/10. Stating it is more soreness. Her pain is controlled with Tylenol. She has not had to take any Tramadol    Instructed to call office with any fever, chills, purulent drainage, uncontrolled pain or any other questions or concerns. Patient verbalizes understanding.   Pt aware of post op appointments as well as the office number [phone] and after hours number [phone] to call if she has any questions or concerns

## 2022-09-23 LAB — SURGICAL PATHOLOGY

## 2022-09-28 ENCOUNTER — Inpatient Hospital Stay: Payer: Medicare Other | Attending: Gynecologic Oncology | Admitting: Gynecologic Oncology

## 2022-09-28 ENCOUNTER — Encounter: Payer: Self-pay | Admitting: Gynecologic Oncology

## 2022-09-28 DIAGNOSIS — N9489 Other specified conditions associated with female genital organs and menstrual cycle: Secondary | ICD-10-CM

## 2022-09-28 DIAGNOSIS — N950 Postmenopausal bleeding: Secondary | ICD-10-CM

## 2022-09-28 NOTE — Progress Notes (Signed)
Gynecologic Oncology Telehealth Note: Gyn-Onc  I connected with [name] on 09/28/22 at  4:45 PM EDT by telephone and verified that I am speaking with the correct person using two identifiers.  I discussed the limitations, risks, security and privacy concerns of performing an evaluation and management service by telemedicine and the availability of in-person appointments. I also discussed with the patient that there may be a patient responsible charge related to this service. The patient expressed understanding and agreed to proceed.  Other persons participating in the visit and their role in the encounter: none.  Patient's location: hom Provider's location: [facility]  Reason for Visit: follow-up after surgery  Treatment History: 09/21/22: Dilation and curettage, robotic-assisted laparoscopic total hysterectomy with bilateral salpingoophorectomy, oversew of bladder peritoneum, primary umbilical hernia repair.  Interval History: Doing well. Denies abdominal/pelvic pain. Denies urinary symptoms, bowels functioning normally.   Past Medical/Surgical History: Past Medical History:  Diagnosis Date   Heart murmur 09/13/2022   not detected by PCP   High cholesterol    History of Hypercalcemia    resolved with surgery   history of Hyperparathyroidism ([place])    s/p surgery   Hypertension    Low vitamin D level    Migraines    history of migraines prior to menopause, now rare   Osteopenia     Past Surgical History:  Procedure Laterality Date   DILATION AND CURETTAGE OF UTERUS N/A 09/21/2022   Procedure: DILATATION AND CURETTAGE;  Surgeon: Lafonda Mosses, MD;  Location: [facility];  Service: Gynecology;  Laterality: N/A;   PARATHYROIDECTOMY     last 5 yesrs   ROBOTIC ASSISTED SALPINGO OOPHERECTOMY Bilateral 09/21/2022   Procedure: XI ROBOTIC ASSISTED SALPINGO OOPHORECTOMY, TOTAL HYSTERECTOMY;  Surgeon: Lafonda Mosses, MD;  Location: [facility];  Service: Gynecology;  Laterality:  Bilateral;   UMBILICAL HERNIA REPAIR N/A 09/21/2022   Procedure: HERNIA REPAIR UMBILICAL ADULT;  Surgeon: Lafonda Mosses, MD;  Location: [facility];  Service: Gynecology;  Laterality: N/A;    Family History  Problem Relation Age of Onset   Breast cancer Mother        Rare form of breast cancer- didnt require any chemo/rad- per pt   Ovarian cysts Mother        removed, no further f/u   Colon cancer Neg Hx    Endometrial cancer Neg Hx    Ovarian cancer Neg Hx    Pancreatic cancer Neg Hx    Prostate cancer Neg Hx     Social History   Socioeconomic History   Marital status: Single    Spouse name: Not on file   Number of children: Not on file   Years of education: Not on file   Highest education level: Not on file  Occupational History   Not on file  Tobacco Use   Smoking status: Never   Smokeless tobacco: Never  Vaping Use   Vaping Use: Never used  Substance and Sexual Activity   Alcohol use: No   Drug use: No   Sexual activity: Yes    Birth control/protection: Post-menopausal  Other Topics Concern   Not on file  Social History Narrative   Not on file   Social Determinants of Health   Financial Resource Strain: Not on file  Food Insecurity: Not on file  Transportation Needs: Not on file  Physical Activity: Not on file  Stress: Not on file  Social Connections: Not on file    Current Medications:  Current Outpatient Medications:  acetaminophen (TYLENOL) 500 MG tablet, Take 500 mg by mouth every 6 (six) hours as needed (headaches/pain.)., Disp: , Rfl:    amoxicillin (AMOXIL) 500 MG capsule, Take 2,000 mg by mouth See admin instructions. Take 4 capsules (2000 mg) by mouth 1 hour prior to dental appointments., Disp: , Rfl:    beta carotene w/minerals (OCUVITE) tablet, Take 1 tablet by mouth in the morning., Disp: , Rfl:    Calamine 8-8 % LOTN, Apply 1 Application topically as needed (itching/skin irritation.)., Disp: , Rfl:    Cholecalciferol (VITAMIN D3) 50 MCG  (2000 UT) TABS, Take 2,000 Units by mouth in the morning., Disp: , Rfl:    diphenhydrAMINE (BENADRYL) 25 MG tablet, Take 25 mg by mouth every 6 (six) hours as needed (allergies/itching.)., Disp: , Rfl:    fexofenadine (ALLEGRA) 180 MG tablet, Take 180 mg by mouth daily as needed for allergies., Disp: , Rfl:    GNP GARLIC EXTRACT PO, Take 500 mg by mouth in the morning. Kyolic Aged Garlic Extract Formula 324 Cardiovascular Health, Disp: , Rfl:    oxymetazoline (NASAL RELIEF) 0.05 % nasal spray, Place 1 spray into the nose daily as needed (nose bleeds)., Disp: , Rfl:    senna-docusate (SENOKOT-S) 8.6-50 MG tablet, Take 2 tablets by mouth at bedtime. For AFTER surgery, do not take if having diarrhea, Disp: 30 tablet, Rfl: 1   Simethicone (GAS RELIEF) 180 MG CAPS, Take 180 mg by mouth daily as needed (gas/bloating)., Disp: , Rfl:    telmisartan (MICARDIS) 20 MG tablet, Take 20 mg by mouth in the morning., Disp: , Rfl:    traMADol (ULTRAM) 50 MG tablet, Take 1 tablet (50 mg total) by mouth every 6 (six) hours as needed for severe pain. For AFTER surgery only, do not take and drive, Disp: 10 tablet, Rfl: 0  Review of Symptoms: Pertinent positives as per HPI.  Physical Exam: Deferred given limitations of phone visit.  Laboratory & Radiologic Studies: A. ENDOMETRIUM, CURETTAGE:  - Superficial fragments of benign surface endometrium  - Negative for hyperplasia or malignancy   B. OVARY AND FALLOPIAN TUBE, RIGHT, SALPINGO OOPHORECTOMY:  - Benign serous cystadenoma  - Complete cross-section of unremarkable fallopian tube   C. OVARY AND FALLOPIAN TUBE, LEFT, SALPINGO OOPHORECTOMY:  - Benign serous cystadenoma  - Complete cross-section of unremarkable fallopian tube   D. UTERUS AND CERVIX, HYSTERECTOMY:  - Benign ectocervical and endocervical mucosa with no significant  pathologic changes  - Benign secretory endometrium  - Adenomyosis  - Negative for malignancy   PELVIC, WASHING:  FINAL  MICROSCOPIC DIAGNOSIS:  - No malignant cells identified   Assessment & Plan: Lori Cohen is a 71 y.o. woman s/p TRH/BSO for bilateral adnexal masses and PMB with benign final pathology.  Doing well.  Meeting postoperative milestones.  Discussed continued expectations and restrictions.  Patient very happy with final pathology.  All questions answered.  I discussed the assessment and treatment plan with the patient. The patient was provided with an opportunity to ask questions and all were answered. The patient agreed with the plan and demonstrated an understanding of the instructions.   The patient was advised to call back or see an in-person evaluation if the symptoms worsen or if the condition fails to improve as anticipated.   8 minutes of total time was spent for this patient encounter, including preparation, phone counseling with the patient and coordination of care, and documentation of the encounter.   Jeral Pinch, MD  Division of Gynecologic  Oncology  Department of Obstetrics and Gynecology  [facility]

## 2022-10-05 ENCOUNTER — Telehealth: Payer: Self-pay | Admitting: *Deleted

## 2022-10-05 NOTE — Telephone Encounter (Signed)
Patient called back and appt was moved

## 2022-10-05 NOTE — Telephone Encounter (Signed)
Called and left the patient a message to call the office back. Need to move her appt on 10/17 from 4 pm to 2:15 pm.

## 2022-10-10 ENCOUNTER — Encounter: Payer: Self-pay | Admitting: Gynecologic Oncology

## 2022-10-11 ENCOUNTER — Encounter: Payer: Self-pay | Admitting: Gynecologic Oncology

## 2022-10-11 ENCOUNTER — Inpatient Hospital Stay (HOSPITAL_BASED_OUTPATIENT_CLINIC_OR_DEPARTMENT_OTHER): Payer: Medicare Other | Admitting: Gynecologic Oncology

## 2022-10-11 VITALS — BP 138/80 | HR 67 | Temp 97.6°F | Resp 17 | Ht 60.0 in | Wt 126.2 lb

## 2022-10-11 DIAGNOSIS — N9489 Other specified conditions associated with female genital organs and menstrual cycle: Secondary | ICD-10-CM

## 2022-10-11 DIAGNOSIS — Z9889 Other specified postprocedural states: Secondary | ICD-10-CM

## 2022-10-11 NOTE — Progress Notes (Signed)
Gynecologic Oncology Return Clinic Visit  10/11/2022  Reason for Visit: Follow-up after recent surgery  Treatment History: 01/21/2022: D&C, robotic assisted total hysterectomy with BSO, oversew bladder peritoneum, primary umbilical hernia repair.  Surgery was performed in the setting of bilateral adnexal masses, episode of postmenopausal bleeding earlier this year.  Interval History: Doing well.  Denies any abdominal or pelvic pain.  Reports baseline bowel bladder function.  Denies any vaginal bleeding or discharge.  Has been careful not to do any heavy lifting.  Past Medical/Surgical History: Past Medical History:  Diagnosis Date   Heart murmur 09/13/2022   not detected by PCP   High cholesterol    History of Hypercalcemia    resolved with surgery   history of Hyperparathyroidism ([place])    s/p surgery   Hypertension    Low vitamin D level    Migraines    history of migraines prior to menopause, now rare   Osteopenia     Past Surgical History:  Procedure Laterality Date   DILATION AND CURETTAGE OF UTERUS N/A 09/21/2022   Procedure: DILATATION AND CURETTAGE;  Surgeon: Lafonda Mosses, MD;  Location: [facility];  Service: Gynecology;  Laterality: N/A;   PARATHYROIDECTOMY     last 5 yesrs   ROBOTIC ASSISTED SALPINGO OOPHERECTOMY Bilateral 09/21/2022   Procedure: XI ROBOTIC ASSISTED SALPINGO OOPHORECTOMY, TOTAL HYSTERECTOMY;  Surgeon: Lafonda Mosses, MD;  Location: [facility];  Service: Gynecology;  Laterality: Bilateral;   UMBILICAL HERNIA REPAIR N/A 09/21/2022   Procedure: HERNIA REPAIR UMBILICAL ADULT;  Surgeon: Lafonda Mosses, MD;  Location: [facility];  Service: Gynecology;  Laterality: N/A;    Family History  Problem Relation Age of Onset   Breast cancer Mother        Rare form of breast cancer- didnt require any chemo/rad- per pt   Ovarian cysts Mother        removed, no further f/u   Colon cancer Neg Hx    Endometrial cancer Neg Hx    Ovarian cancer Neg Hx     Pancreatic cancer Neg Hx    Prostate cancer Neg Hx     Social History   Socioeconomic History   Marital status: Single    Spouse name: Not on file   Number of children: Not on file   Years of education: Not on file   Highest education level: Not on file  Occupational History   Not on file  Tobacco Use   Smoking status: Never   Smokeless tobacco: Never  Vaping Use   Vaping Use: Never used  Substance and Sexual Activity   Alcohol use: No   Drug use: No   Sexual activity: Yes    Birth control/protection: Post-menopausal  Other Topics Concern   Not on file  Social History Narrative   Not on file   Social Determinants of Health   Financial Resource Strain: Not on file  Food Insecurity: Not on file  Transportation Needs: Not on file  Physical Activity: Not on file  Stress: Not on file  Social Connections: Not on file    Current Medications:  Current Outpatient Medications:    acetaminophen (TYLENOL) 500 MG tablet, Take 500 mg by mouth every 6 (six) hours as needed (headaches/pain.)., Disp: , Rfl:    amoxicillin (AMOXIL) 500 MG capsule, Take 2,000 mg by mouth See admin instructions. Take 4 capsules (2000 mg) by mouth 1 hour prior to dental appointments., Disp: , Rfl:    beta carotene w/minerals (OCUVITE) tablet, Take  1 tablet by mouth in the morning., Disp: , Rfl:    Calamine 8-8 % LOTN, Apply 1 Application topically as needed (itching/skin irritation.)., Disp: , Rfl:    Cholecalciferol (VITAMIN D3) 50 MCG (2000 UT) TABS, Take 2,000 Units by mouth in the morning., Disp: , Rfl:    diphenhydrAMINE (BENADRYL) 25 MG tablet, Take 25 mg by mouth every 6 (six) hours as needed (allergies/itching.)., Disp: , Rfl:    fexofenadine (ALLEGRA) 180 MG tablet, Take 180 mg by mouth daily as needed for allergies., Disp: , Rfl:    GNP GARLIC EXTRACT PO, Take 500 mg by mouth in the morning. Kyolic Aged Garlic Extract Formula 941 Cardiovascular Health, Disp: , Rfl:    oxymetazoline (NASAL  RELIEF) 0.05 % nasal spray, Place 1 spray into the nose daily as needed (nose bleeds)., Disp: , Rfl:    senna-docusate (SENOKOT-S) 8.6-50 MG tablet, Take 2 tablets by mouth at bedtime. For AFTER surgery, do not take if having diarrhea, Disp: 30 tablet, Rfl: 1   Simethicone (GAS RELIEF) 180 MG CAPS, Take 180 mg by mouth daily as needed (gas/bloating)., Disp: , Rfl:    telmisartan (MICARDIS) 20 MG tablet, Take 20 mg by mouth in the morning., Disp: , Rfl:    traMADol (ULTRAM) 50 MG tablet, Take 1 tablet (50 mg total) by mouth every 6 (six) hours as needed for severe pain. For AFTER surgery only, do not take and drive, Disp: 10 tablet, Rfl: 0  Review of Systems: Denies appetite changes, fevers, chills, fatigue, unexplained weight changes. Denies hearing loss, neck lumps or masses, mouth sores, ringing in ears or voice changes. Denies cough or wheezing.  Denies shortness of breath. Denies chest pain or palpitations. Denies leg swelling. Denies abdominal distention, pain, blood in stools, constipation, diarrhea, nausea, vomiting, or early satiety. Denies pain with intercourse, dysuria, frequency, hematuria or incontinence. Denies hot flashes, pelvic pain, vaginal bleeding or vaginal discharge.   Denies joint pain, back pain or muscle pain/cramps. Denies itching, rash, or wounds. Denies dizziness, headaches, numbness or seizures. Denies swollen lymph nodes or glands, denies easy bruising or bleeding. Denies anxiety, depression, confusion, or decreased concentration.  Physical Exam: BP 138/80 (BP Location: Left Arm, Patient Position: Sitting)   Pulse 67   Temp 97.6 F (36.4 C) (Oral)   Resp 17   Ht 5' (1.524 m)   Wt 126 lb 4 oz (57.3 kg)   SpO2 100%   BMI 24.66 kg/m  General: Alert, oriented, no acute distress. HEENT: Normocephalic, atraumatic, sclera anicteric. Chest: Unlabored breathing on room air. Abdomen: soft, nontender.  Normoactive bowel sounds.  No masses or hepatosplenomegaly  appreciated.  Well-healed visions.  Remaining Dermabond removed. Extremities: Grossly normal range of motion.  Warm, well perfused.  No edema bilaterally. GU: Normal appearing external genitalia without erythema, excoriation, or lesions.  Speculum exam reveals cuff intact, suture visible.  Bimanual exam reveals intact, no fluctuance or tenderness with palpation.   Laboratory & Radiologic Studies: A. ENDOMETRIUM, CURETTAGE:  - Superficial fragments of benign surface endometrium  - Negative for hyperplasia or malignancy   B. OVARY AND FALLOPIAN TUBE, RIGHT, SALPINGO OOPHORECTOMY:  - Benign serous cystadenoma  - Complete cross-section of unremarkable fallopian tube   C. OVARY AND FALLOPIAN TUBE, LEFT, SALPINGO OOPHORECTOMY:  - Benign serous cystadenoma  - Complete cross-section of unremarkable fallopian tube   D. UTERUS AND CERVIX, HYSTERECTOMY:  - Benign ectocervical and endocervical mucosa with no significant  pathologic changes  - Benign secretory endometrium  -  Adenomyosis  - Negative for malignancy   A. PELVIC, WASHING:  FINAL MICROSCOPIC DIAGNOSIS:  - No malignant cells identified   Assessment & Plan: Lori Cohen is a 71 y.o. woman s/p TRH/Bso for bilateral adnexal masses, inability to sample endometrium.  Final pathology benign.  Patient is doing well from a postoperative standpoint.  Discussed continued expectations and restrictions.  Pathology was reviewed again with the patient.  She was given a copy of her pathology report.  All questions were answered.  She was encouraged to follow-up with her OB/GYN for any future gynecologic healthcare needs.  14 minutes of total time was spent for this patient encounter, including preparation, face-to-face counseling with the patient and coordination of care, and documentation of the encounter.  Jeral Pinch, MD  Division of Gynecologic Oncology  Department of Obstetrics and Gynecology  [facility]

## 2022-10-11 NOTE — Patient Instructions (Signed)
You are healing very well from surgery.  Remember, no heavy lifting for at least 6 weeks after surgery and nothing in the vagina for 8-10.  Plan to reach out to your OB/GYN and PCP for continued healthcare needs.  Please do not hesitate to reach out to my office if you need anything in the future.

## 2023-08-16 ENCOUNTER — Encounter: Payer: Self-pay | Admitting: Obstetrics & Gynecology
